# Patient Record
Sex: Male | Born: 1935 | Race: White | Hispanic: No | Marital: Married | Smoking: Former smoker
Health system: Southern US, Community
[De-identification: ages and names within clinical notes are randomized; demographics above are authoritative.]

## PROBLEM LIST (undated history)

## (undated) DIAGNOSIS — J449 Chronic obstructive pulmonary disease, unspecified: Secondary | ICD-10-CM

## (undated) DIAGNOSIS — E119 Type 2 diabetes mellitus without complications: Secondary | ICD-10-CM

## (undated) DIAGNOSIS — I2510 Atherosclerotic heart disease of native coronary artery without angina pectoris: Secondary | ICD-10-CM

## (undated) DIAGNOSIS — I10 Essential (primary) hypertension: Secondary | ICD-10-CM

## (undated) DIAGNOSIS — N189 Chronic kidney disease, unspecified: Secondary | ICD-10-CM

## (undated) DIAGNOSIS — E1140 Type 2 diabetes mellitus with diabetic neuropathy, unspecified: Secondary | ICD-10-CM

## (undated) HISTORY — PX: CHOLECYSTECTOMY: SHX55

## (undated) HISTORY — PX: ABDOMINAL AORTIC ANEURYSM REPAIR: SUR1152

## (undated) HISTORY — PX: CORONARY ARTERY BYPASS GRAFT: SHX141

## (undated) HISTORY — PX: RENAL ARTERY STENT: SHX2321

---

## 2004-09-15 ENCOUNTER — Ambulatory Visit: Payer: Self-pay | Admitting: Internal Medicine

## 2005-01-30 ENCOUNTER — Emergency Department: Payer: Self-pay | Admitting: Emergency Medicine

## 2005-03-20 ENCOUNTER — Ambulatory Visit: Payer: Self-pay | Admitting: Internal Medicine

## 2005-05-25 ENCOUNTER — Ambulatory Visit: Payer: Self-pay | Admitting: Internal Medicine

## 2005-09-22 ENCOUNTER — Ambulatory Visit: Payer: Self-pay | Admitting: Internal Medicine

## 2005-11-02 ENCOUNTER — Ambulatory Visit (HOSPITAL_COMMUNITY): Admission: RE | Admit: 2005-11-02 | Discharge: 2005-11-02 | Payer: Self-pay | Admitting: *Deleted

## 2005-11-11 ENCOUNTER — Ambulatory Visit (HOSPITAL_COMMUNITY): Admission: RE | Admit: 2005-11-11 | Discharge: 2005-11-12 | Payer: Self-pay | Admitting: *Deleted

## 2005-12-10 ENCOUNTER — Ambulatory Visit: Payer: Self-pay | Admitting: *Deleted

## 2006-09-14 ENCOUNTER — Ambulatory Visit: Payer: Self-pay | Admitting: Internal Medicine

## 2007-03-18 ENCOUNTER — Ambulatory Visit: Payer: Self-pay | Admitting: Internal Medicine

## 2007-04-18 ENCOUNTER — Ambulatory Visit: Payer: Self-pay | Admitting: Vascular Surgery

## 2007-12-01 ENCOUNTER — Encounter: Admission: RE | Admit: 2007-12-01 | Discharge: 2007-12-01 | Payer: Self-pay | Admitting: Internal Medicine

## 2008-10-03 ENCOUNTER — Ambulatory Visit: Payer: Self-pay | Admitting: Cardiology

## 2008-10-16 ENCOUNTER — Ambulatory Visit: Payer: Self-pay | Admitting: Thoracic Surgery (Cardiothoracic Vascular Surgery)

## 2008-10-17 ENCOUNTER — Encounter: Payer: Self-pay | Admitting: Thoracic Surgery (Cardiothoracic Vascular Surgery)

## 2008-10-19 ENCOUNTER — Ambulatory Visit: Payer: Self-pay | Admitting: Thoracic Surgery (Cardiothoracic Vascular Surgery)

## 2008-10-19 ENCOUNTER — Inpatient Hospital Stay (HOSPITAL_COMMUNITY)
Admission: RE | Admit: 2008-10-19 | Discharge: 2008-10-29 | Payer: Self-pay | Admitting: Thoracic Surgery (Cardiothoracic Vascular Surgery)

## 2008-11-15 ENCOUNTER — Encounter
Admission: RE | Admit: 2008-11-15 | Discharge: 2008-11-15 | Payer: Self-pay | Admitting: Thoracic Surgery (Cardiothoracic Vascular Surgery)

## 2008-11-15 ENCOUNTER — Ambulatory Visit: Payer: Self-pay | Admitting: Thoracic Surgery (Cardiothoracic Vascular Surgery)

## 2008-12-13 ENCOUNTER — Encounter: Payer: Self-pay | Admitting: Cardiology

## 2008-12-26 ENCOUNTER — Encounter: Payer: Self-pay | Admitting: Cardiology

## 2009-01-25 ENCOUNTER — Encounter: Payer: Self-pay | Admitting: Cardiology

## 2009-06-07 ENCOUNTER — Ambulatory Visit: Payer: Self-pay | Admitting: Internal Medicine

## 2009-06-18 ENCOUNTER — Ambulatory Visit: Payer: Self-pay | Admitting: Vascular Surgery

## 2009-08-05 IMAGING — CR DG CHEST 2V
2 series · 2 of 2 positions shown · non-contrast
Comparison: None

CLINICAL DATA: CAD

CHEST - 2 VIEW

[view not recorded (1 of 2)]
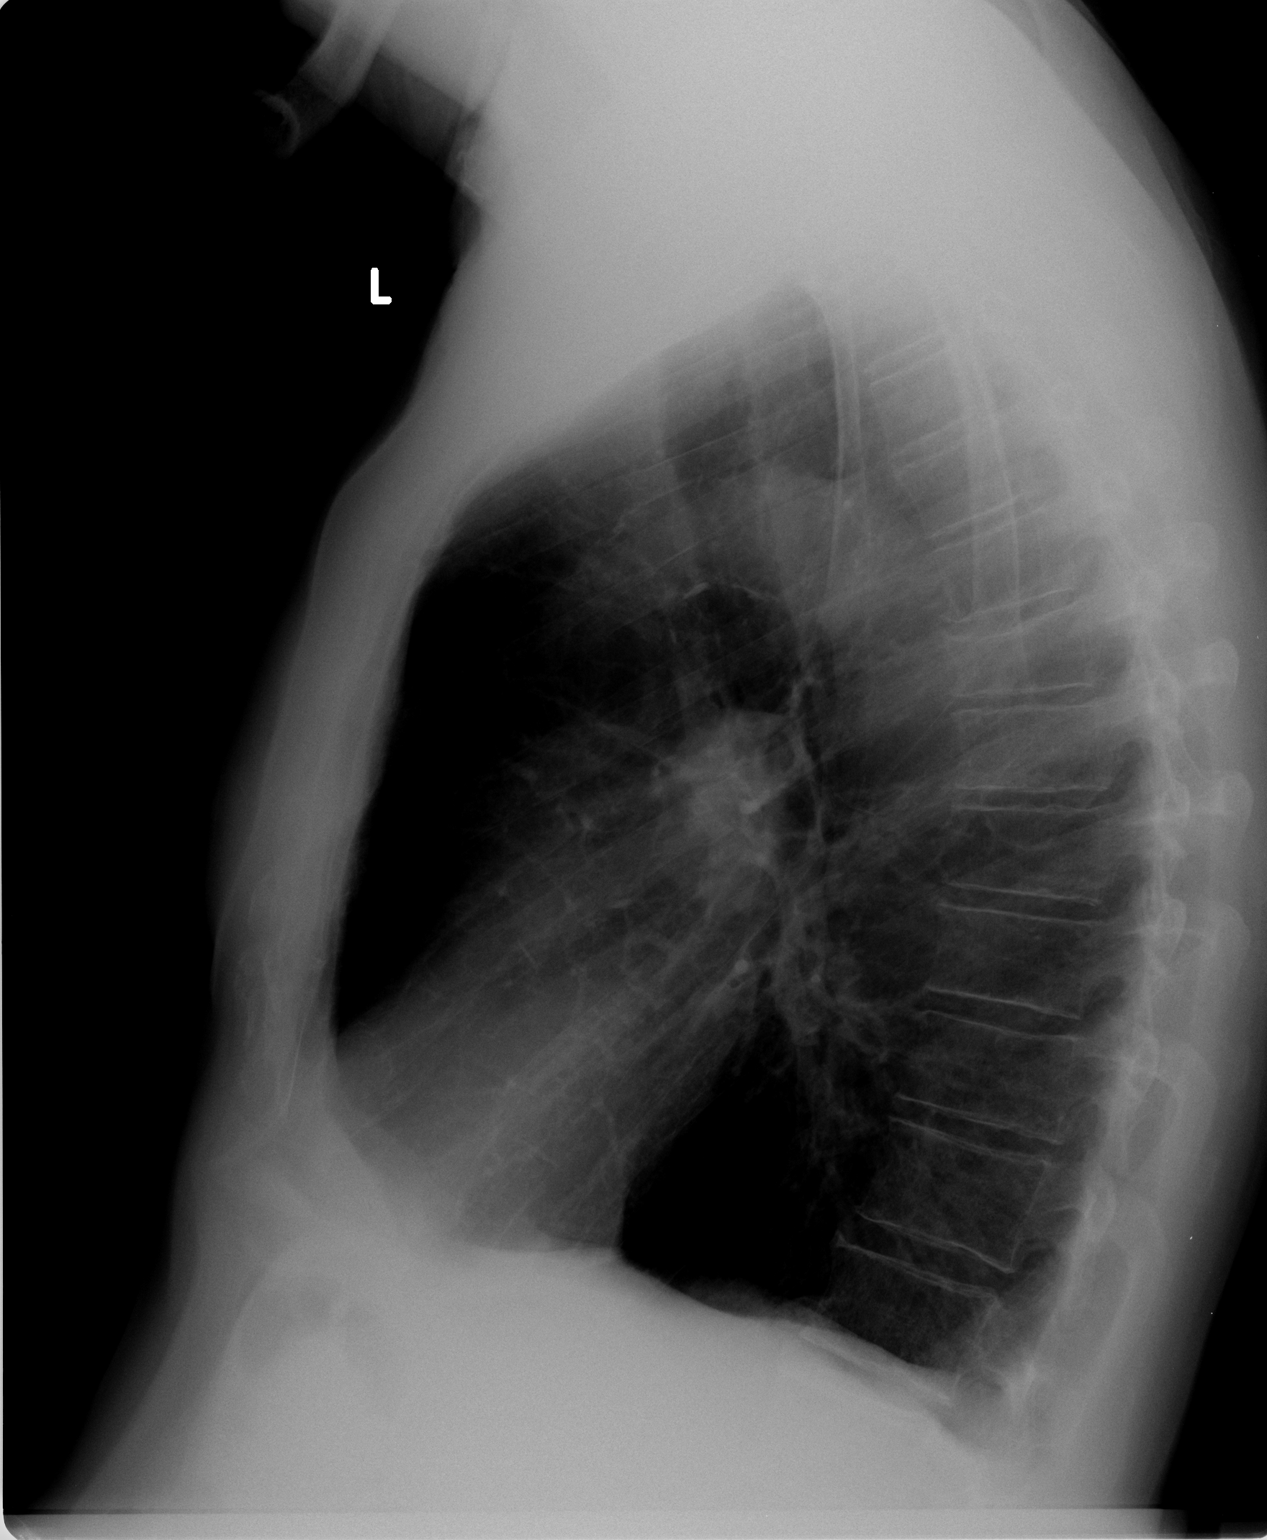

[view not recorded (2 of 2)]
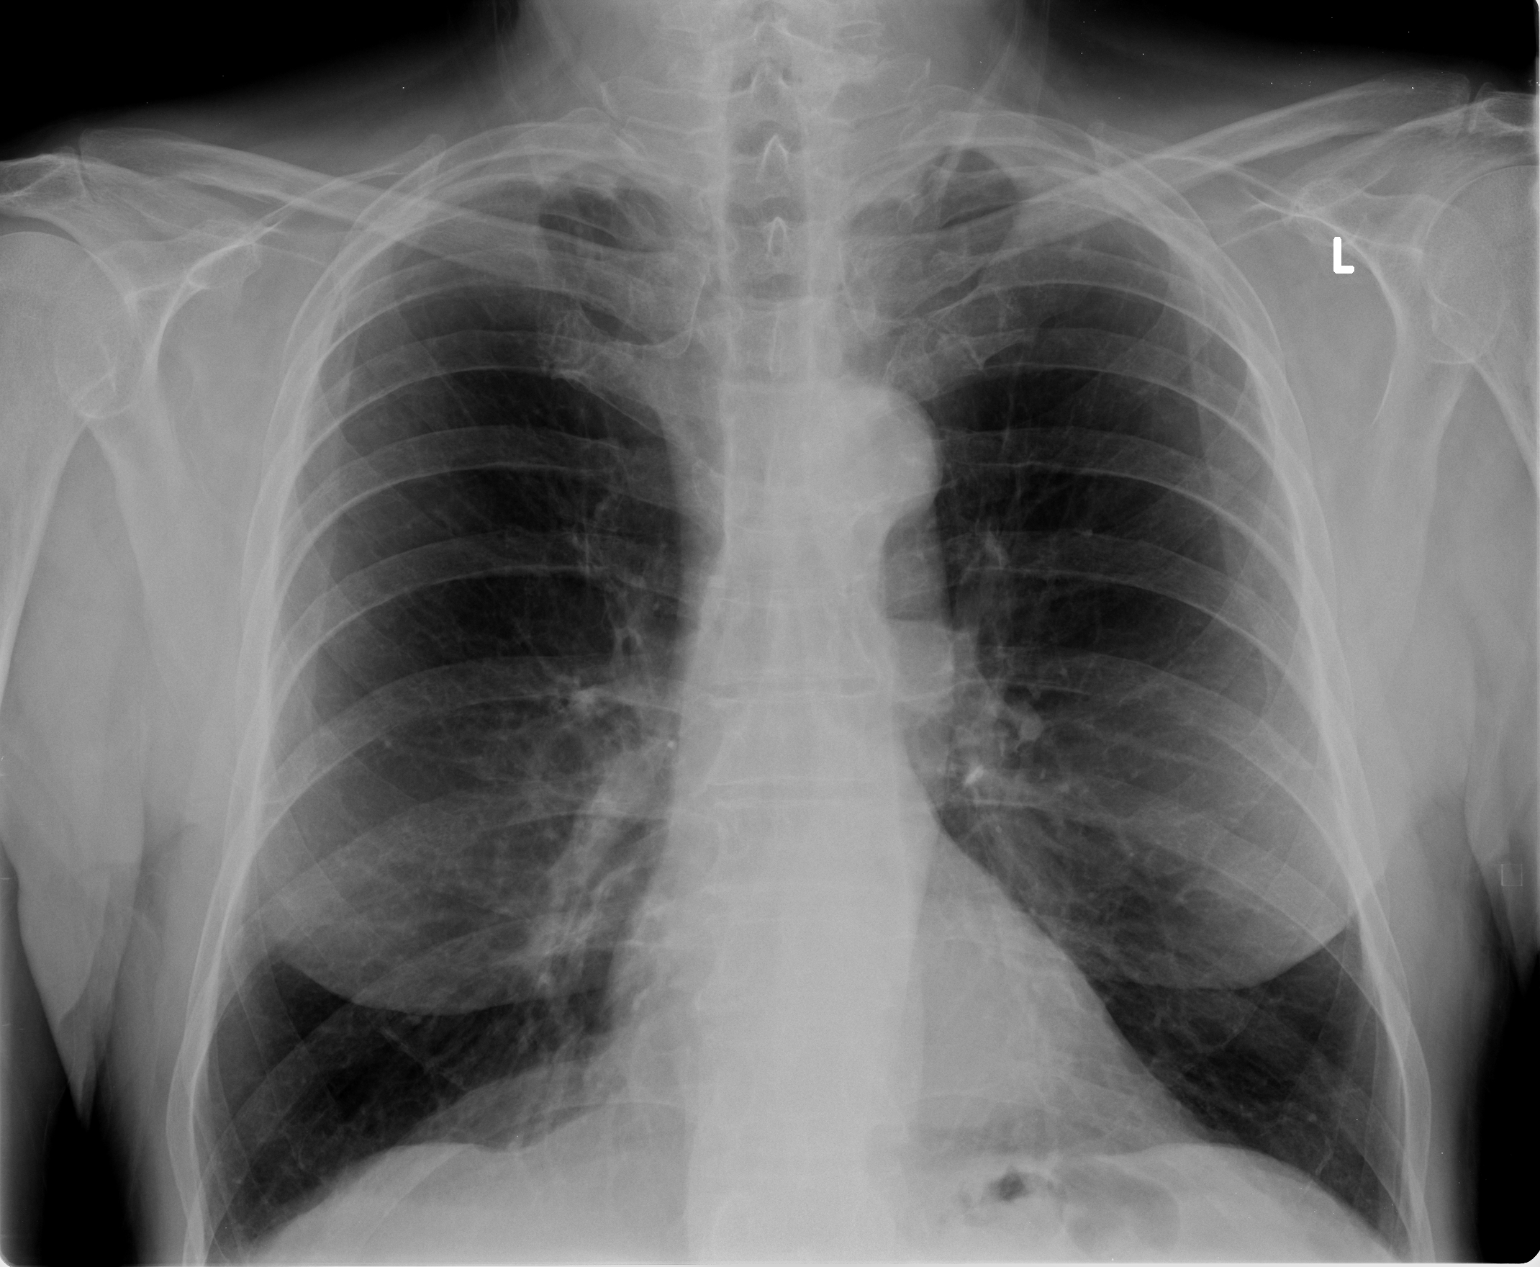

[2 of 2 positions shown; findings below may reference images not displayed]

FINDINGS: Cardiomediastinal silhouette is unremarkable.  No acute
infiltrate or pleural effusion.  No pulmonary edema.  Mild
compression superior endplate of T6 vertebral body of indeterminate
age.
IMPRESSION: No acute infiltrate or edema.  Mild compression of superior
endplate of T6 vertebral body of indeterminate age.  Clinical
correlation is necessary.

## 2009-08-09 IMAGING — CR DG CHEST 1V PORT
1 series · 1 of 1 positions shown · non-contrast
Comparison: 10/20/2008

CLINICAL DATA: Coronary bypass, follow-up

PORTABLE CHEST - 1 VIEW

[AP]
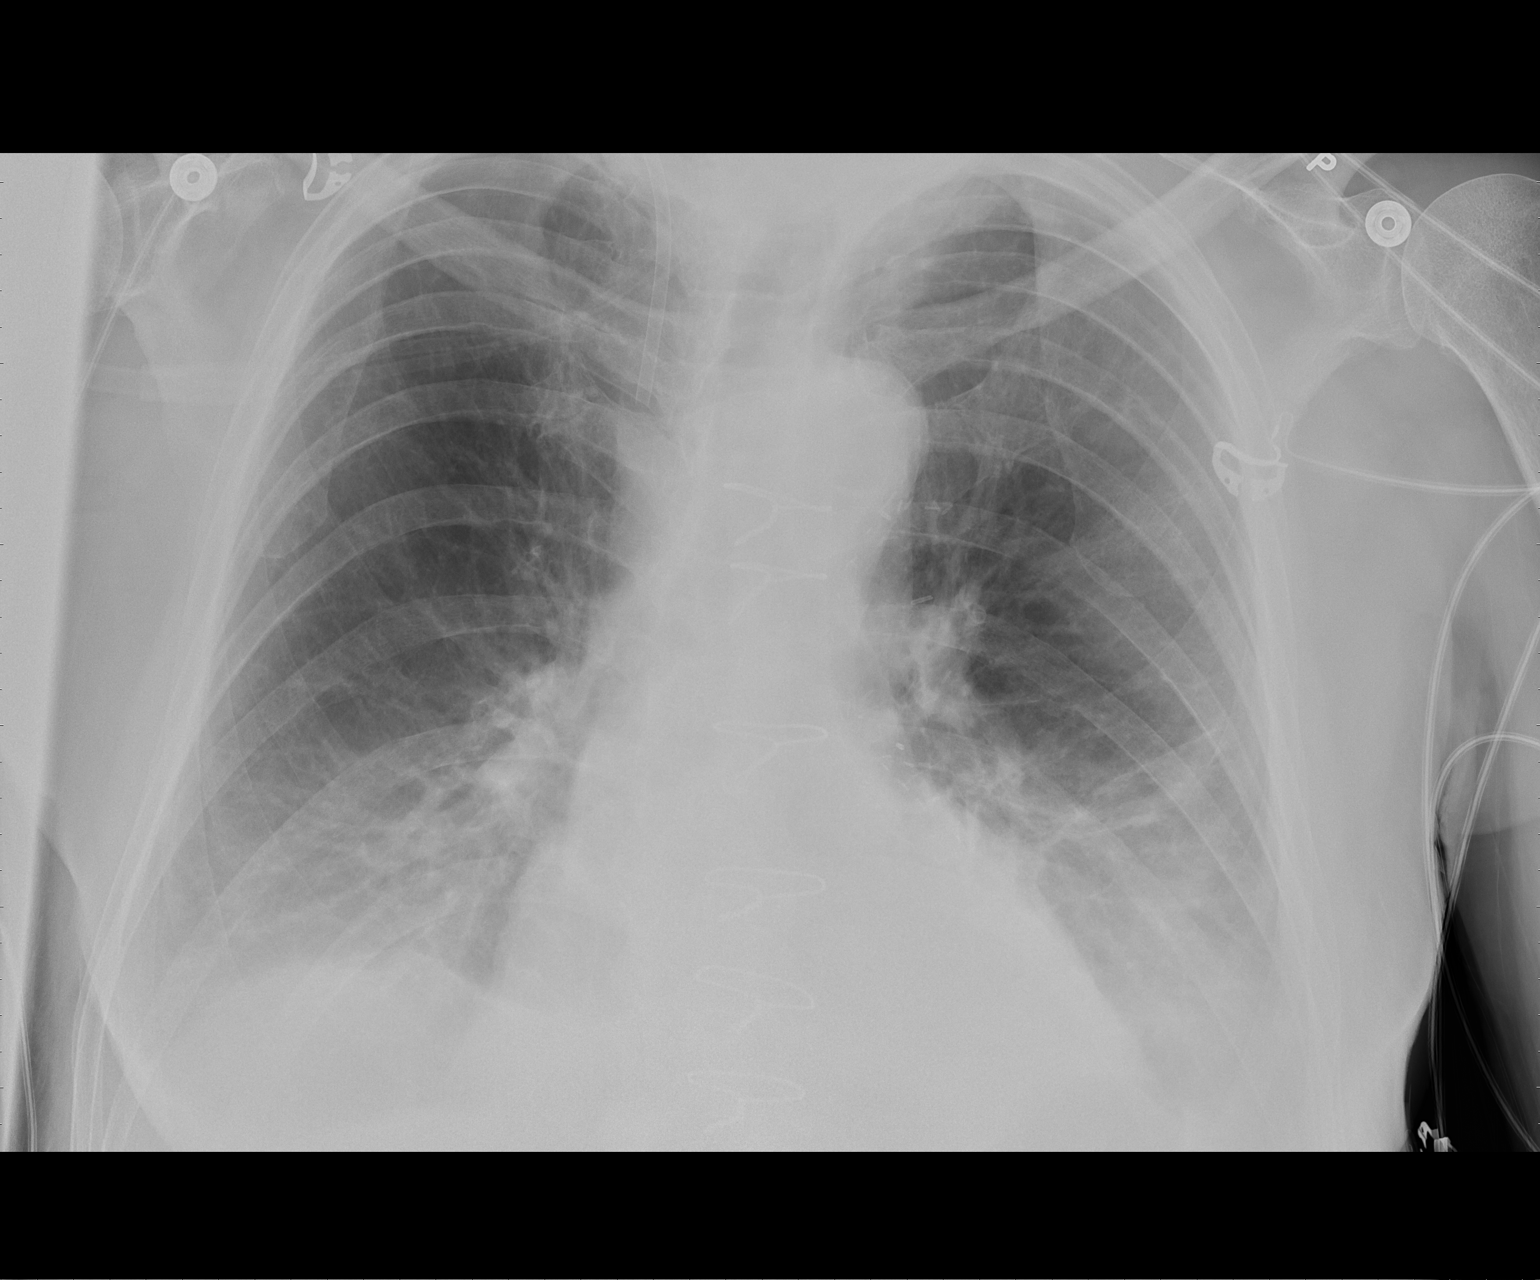

[1 of 1 positions shown; findings below may reference images not displayed]

FINDINGS: Interval removal of the Swan-Ganz catheter, mediastinal
drain and left chest tube.  Increased basilar atelectasis with
small effusions.  Hemidiaphragms are obscured.  Negative for
significant edema.  No pneumothorax.
IMPRESSION: Increased basilar atelectasis and small pleural effusions.

## 2009-08-10 IMAGING — CR DG CHEST 1V PORT
1 series · 1 of 1 positions shown · non-contrast
Comparison: the previous day's study

CLINICAL DATA: Coronary artery disease, postop

PORTABLE CHEST - 1 VIEW

[AP]
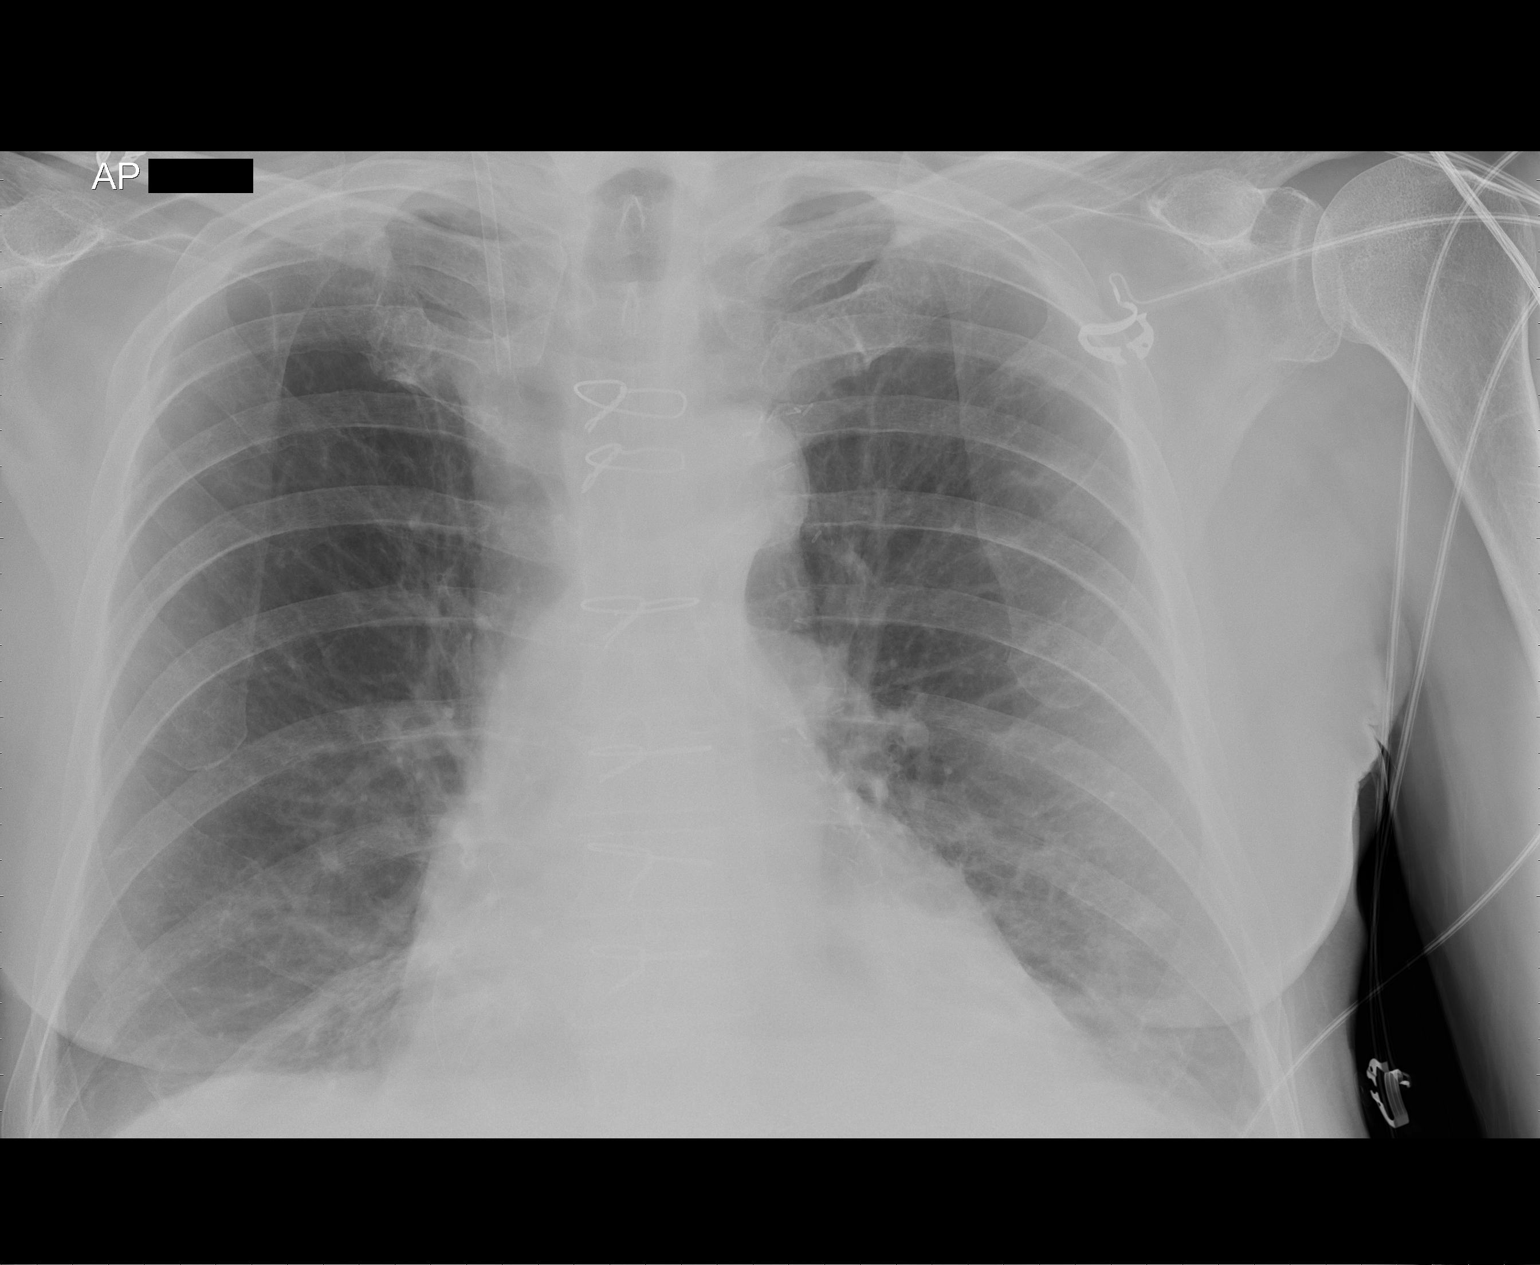

[1 of 1 positions shown; findings below may reference images not displayed]

FINDINGS: Right IJ venous sheath stable.  Previous CABG.  Heart
size upper limits normal.  Improved aeration in the lung bases with
some residual left retrocardiac consolidation / atelectasis.
Question small residual left pleural effusion.
IMPRESSION: 1.  Improved bibasilar aeration with minimal residual left
retrocardiac consolidation / atelectasis.

## 2010-01-25 ENCOUNTER — Ambulatory Visit: Payer: Self-pay | Admitting: Internal Medicine

## 2010-01-28 ENCOUNTER — Ambulatory Visit: Payer: Self-pay | Admitting: Family Medicine

## 2010-01-29 ENCOUNTER — Ambulatory Visit: Payer: Self-pay | Admitting: Urology

## 2010-02-12 ENCOUNTER — Ambulatory Visit: Payer: Self-pay | Admitting: Urology

## 2010-03-12 ENCOUNTER — Emergency Department: Payer: Self-pay | Admitting: Emergency Medicine

## 2010-04-04 ENCOUNTER — Ambulatory Visit: Payer: Self-pay | Admitting: Internal Medicine

## 2010-05-26 ENCOUNTER — Ambulatory Visit: Payer: Self-pay | Admitting: Urology

## 2010-08-03 LAB — BASIC METABOLIC PANEL
BUN: 18 mg/dL (ref 6–23)
CO2: 25 mEq/L (ref 19–32)
CO2: 26 mEq/L (ref 19–32)
CO2: 27 mEq/L (ref 19–32)
CO2: 28 mEq/L (ref 19–32)
CO2: 28 mEq/L (ref 19–32)
Calcium: 8.6 mg/dL (ref 8.4–10.5)
Calcium: 8.8 mg/dL (ref 8.4–10.5)
Chloride: 100 mEq/L (ref 96–112)
Chloride: 98 mEq/L (ref 96–112)
Creatinine, Ser: 1.63 mg/dL — ABNORMAL HIGH (ref 0.4–1.5)
Creatinine, Ser: 1.7 mg/dL — ABNORMAL HIGH (ref 0.4–1.5)
GFR calc Af Amer: 44 mL/min — ABNORMAL LOW (ref >60)
GFR calc Af Amer: 50 mL/min — ABNORMAL LOW (ref >60)
GFR calc Af Amer: 50 mL/min — ABNORMAL LOW (ref >60)
GFR calc non Af Amer: 41 mL/min — ABNORMAL LOW (ref >60)
GFR calc non Af Amer: 41 mL/min — ABNORMAL LOW (ref >60)
Glucose, Bld: 63 mg/dL — ABNORMAL LOW (ref 70–99)
Glucose, Bld: 82 mg/dL (ref 70–99)
Potassium: 3.8 mEq/L (ref 3.5–5.1)
Potassium: 4 mEq/L (ref 3.5–5.1)
Potassium: 4 mEq/L (ref 3.5–5.1)
Sodium: 131 mEq/L — ABNORMAL LOW (ref 135–145)
Sodium: 134 mEq/L — ABNORMAL LOW (ref 135–145)
Sodium: 135 mEq/L (ref 135–145)

## 2010-08-03 LAB — GLUCOSE, CAPILLARY
Glucose-Capillary: 104 mg/dL — ABNORMAL HIGH (ref 70–99)
Glucose-Capillary: 112 mg/dL — ABNORMAL HIGH (ref 70–99)
Glucose-Capillary: 124 mg/dL — ABNORMAL HIGH (ref 70–99)
Glucose-Capillary: 162 mg/dL — ABNORMAL HIGH (ref 70–99)
Glucose-Capillary: 183 mg/dL — ABNORMAL HIGH (ref 70–99)
Glucose-Capillary: 195 mg/dL — ABNORMAL HIGH (ref 70–99)
Glucose-Capillary: 64 mg/dL — ABNORMAL LOW (ref 70–99)
Glucose-Capillary: 86 mg/dL (ref 70–99)

## 2010-08-03 LAB — CBC
HCT: 25.8 % — ABNORMAL LOW (ref 39.0–52.0)
Hemoglobin: 9.1 g/dL — ABNORMAL LOW (ref 13.0–17.0)
MCV: 91.2 fL (ref 78.0–100.0)
RBC: 2.82 MIL/uL — ABNORMAL LOW (ref 4.22–5.81)
WBC: 7.2 10*3/uL (ref 4.0–10.5)

## 2010-08-04 LAB — POCT I-STAT 4, (NA,K, GLUC, HGB,HCT)
Glucose, Bld: 134 mg/dL — ABNORMAL HIGH (ref 70–99)
Glucose, Bld: 142 mg/dL — ABNORMAL HIGH (ref 70–99)
Glucose, Bld: 148 mg/dL — ABNORMAL HIGH (ref 70–99)
Glucose, Bld: 164 mg/dL — ABNORMAL HIGH (ref 70–99)
Glucose, Bld: 215 mg/dL — ABNORMAL HIGH (ref 70–99)
HCT: 21 % — ABNORMAL LOW (ref 39.0–52.0)
HCT: 21 % — ABNORMAL LOW (ref 39.0–52.0)
HCT: 29 % — ABNORMAL LOW (ref 39.0–52.0)
HCT: 34 % — ABNORMAL LOW (ref 39.0–52.0)
Hemoglobin: 7.1 g/dL — CL (ref 13.0–17.0)
Hemoglobin: 7.1 g/dL — CL (ref 13.0–17.0)
Hemoglobin: 7.8 g/dL — CL (ref 13.0–17.0)
Hemoglobin: 7.8 g/dL — CL (ref 13.0–17.0)
Hemoglobin: 9.9 g/dL — ABNORMAL LOW (ref 13.0–17.0)
Potassium: 4 mEq/L (ref 3.5–5.1)
Potassium: 4 mEq/L (ref 3.5–5.1)
Potassium: 4.1 mEq/L (ref 3.5–5.1)
Potassium: 4.2 mEq/L (ref 3.5–5.1)
Potassium: 4.7 mEq/L (ref 3.5–5.1)
Potassium: 5.2 mEq/L — ABNORMAL HIGH (ref 3.5–5.1)
Sodium: 133 mEq/L — ABNORMAL LOW (ref 135–145)
Sodium: 135 mEq/L (ref 135–145)
Sodium: 136 mEq/L (ref 135–145)
Sodium: 138 mEq/L (ref 135–145)
Sodium: 142 mEq/L (ref 135–145)

## 2010-08-04 LAB — COMPREHENSIVE METABOLIC PANEL
ALT: 15 U/L (ref 0–53)
AST: 16 U/L (ref 0–37)
AST: 27 U/L (ref 0–37)
Albumin: 3.5 g/dL (ref 3.5–5.2)
Albumin: 3.7 g/dL (ref 3.5–5.2)
Alkaline Phosphatase: 78 U/L (ref 39–117)
Calcium: 9 mg/dL (ref 8.4–10.5)
Chloride: 99 mEq/L (ref 96–112)
Creatinine, Ser: 1.66 mg/dL — ABNORMAL HIGH (ref 0.4–1.5)
GFR calc Af Amer: 49 mL/min — ABNORMAL LOW (ref >60)
GFR calc Af Amer: 55 mL/min — ABNORMAL LOW (ref >60)
Potassium: 4.6 mEq/L (ref 3.5–5.1)
Sodium: 138 mEq/L (ref 135–145)
Total Bilirubin: 0.9 mg/dL (ref 0.3–1.2)
Total Protein: 6.7 g/dL (ref 6.0–8.3)

## 2010-08-04 LAB — CBC
HCT: 26.3 % — ABNORMAL LOW (ref 39.0–52.0)
HCT: 28 % — ABNORMAL LOW (ref 39.0–52.0)
HCT: 29.9 % — ABNORMAL LOW (ref 39.0–52.0)
HCT: 32.9 % — ABNORMAL LOW (ref 39.0–52.0)
HCT: 33.2 % — ABNORMAL LOW (ref 39.0–52.0)
Hemoglobin: 11.5 g/dL — ABNORMAL LOW (ref 13.0–17.0)
Hemoglobin: 9.1 g/dL — ABNORMAL LOW (ref 13.0–17.0)
Hemoglobin: 9.1 g/dL — ABNORMAL LOW (ref 13.0–17.0)
MCHC: 33.5 g/dL (ref 30.0–36.0)
MCHC: 34.5 g/dL (ref 30.0–36.0)
MCHC: 35 g/dL (ref 30.0–36.0)
MCHC: 35.2 g/dL (ref 30.0–36.0)
MCV: 90.1 fL (ref 78.0–100.0)
MCV: 91.1 fL (ref 78.0–100.0)
MCV: 91.6 fL (ref 78.0–100.0)
MCV: 92.3 fL (ref 78.0–100.0)
Platelets: 100 10*3/uL — ABNORMAL LOW (ref 150–400)
Platelets: 166 10*3/uL (ref 150–400)
Platelets: 80 10*3/uL — ABNORMAL LOW (ref 150–400)
Platelets: 89 10*3/uL — ABNORMAL LOW (ref 150–400)
Platelets: 92 10*3/uL — ABNORMAL LOW (ref 150–400)
RBC: 2.87 MIL/uL — ABNORMAL LOW (ref 4.22–5.81)
RBC: 3.23 MIL/uL — ABNORMAL LOW (ref 4.22–5.81)
RBC: 3.65 MIL/uL — ABNORMAL LOW (ref 4.22–5.81)
RDW: 12.9 % (ref 11.5–15.5)
RDW: 13.1 % (ref 11.5–15.5)
RDW: 13.2 % (ref 11.5–15.5)
RDW: 13.7 % (ref 11.5–15.5)
RDW: 14 % (ref 11.5–15.5)
RDW: 14.2 % (ref 11.5–15.5)
WBC: 11.7 10*3/uL — ABNORMAL HIGH (ref 4.0–10.5)
WBC: 13.8 10*3/uL — ABNORMAL HIGH (ref 4.0–10.5)
WBC: 6.4 10*3/uL (ref 4.0–10.5)
WBC: 7 10*3/uL (ref 4.0–10.5)

## 2010-08-04 LAB — POCT I-STAT 3, ART BLOOD GAS (G3+)
Acid-Base Excess: 1 mmol/L (ref 0.0–2.0)
Acid-base deficit: 2 mmol/L (ref 0.0–2.0)
Acid-base deficit: 2 mmol/L (ref 0.0–2.0)
Bicarbonate: 23.2 mEq/L (ref 20.0–24.0)
Bicarbonate: 24 mEq/L (ref 20.0–24.0)
Bicarbonate: 25.5 mEq/L — ABNORMAL HIGH (ref 20.0–24.0)
Bicarbonate: 26.3 mEq/L — ABNORMAL HIGH (ref 20.0–24.0)
O2 Saturation: 100 %
O2 Saturation: 100 %
O2 Saturation: 100 %
O2 Saturation: 93 %
O2 Saturation: 94 %
Patient temperature: 33.8
TCO2: 24 mmol/L (ref 0–100)
TCO2: 25 mmol/L (ref 0–100)
TCO2: 27 mmol/L (ref 0–100)
TCO2: 28 mmol/L (ref 0–100)
TCO2: 30 mmol/L (ref 0–100)
pCO2 arterial: 47.5 mmHg — ABNORMAL HIGH (ref 35.0–45.0)
pCO2 arterial: 50.3 mmHg — ABNORMAL HIGH (ref 35.0–45.0)
pCO2 arterial: 50.4 mmHg — ABNORMAL HIGH (ref 35.0–45.0)
pH, Arterial: 7.313 — ABNORMAL LOW (ref 7.350–7.450)
pH, Arterial: 7.326 — ABNORMAL LOW (ref 7.350–7.450)
pH, Arterial: 7.392 (ref 7.350–7.450)
pH, Arterial: 7.394 (ref 7.350–7.450)
pO2, Arterial: 296 mmHg — ABNORMAL HIGH (ref 80.0–100.0)
pO2, Arterial: 303 mmHg — ABNORMAL HIGH (ref 80.0–100.0)
pO2, Arterial: 398 mmHg — ABNORMAL HIGH (ref 80.0–100.0)
pO2, Arterial: 72 mmHg — ABNORMAL LOW (ref 80.0–100.0)

## 2010-08-04 LAB — POCT I-STAT 3, VENOUS BLOOD GAS (G3P V)
Acid-base deficit: 3 mmol/L — ABNORMAL HIGH (ref 0.0–2.0)
Bicarbonate: 24.2 mEq/L — ABNORMAL HIGH (ref 20.0–24.0)
O2 Saturation: 81 %
TCO2: 26 mmol/L (ref 0–100)
pO2, Ven: 51 mmHg — ABNORMAL HIGH (ref 30.0–45.0)

## 2010-08-04 LAB — MAGNESIUM
Magnesium: 2.4 mg/dL (ref 1.5–2.5)
Magnesium: 2.5 mg/dL (ref 1.5–2.5)

## 2010-08-04 LAB — WOUND CULTURE

## 2010-08-04 LAB — GLUCOSE, CAPILLARY
Glucose-Capillary: 105 mg/dL — ABNORMAL HIGH (ref 70–99)
Glucose-Capillary: 114 mg/dL — ABNORMAL HIGH (ref 70–99)
Glucose-Capillary: 131 mg/dL — ABNORMAL HIGH (ref 70–99)
Glucose-Capillary: 142 mg/dL — ABNORMAL HIGH (ref 70–99)
Glucose-Capillary: 145 mg/dL — ABNORMAL HIGH (ref 70–99)
Glucose-Capillary: 149 mg/dL — ABNORMAL HIGH (ref 70–99)
Glucose-Capillary: 162 mg/dL — ABNORMAL HIGH (ref 70–99)
Glucose-Capillary: 180 mg/dL — ABNORMAL HIGH (ref 70–99)
Glucose-Capillary: 184 mg/dL — ABNORMAL HIGH (ref 70–99)
Glucose-Capillary: 211 mg/dL — ABNORMAL HIGH (ref 70–99)
Glucose-Capillary: 219 mg/dL — ABNORMAL HIGH (ref 70–99)

## 2010-08-04 LAB — BLOOD GAS, ARTERIAL
FIO2: 0.21 %
Patient temperature: 98.6
pCO2 arterial: 38.3 mmHg (ref 35.0–45.0)
pH, Arterial: 7.422 (ref 7.350–7.450)

## 2010-08-04 LAB — TYPE AND SCREEN: Antibody Screen: NEGATIVE

## 2010-08-04 LAB — URINALYSIS, ROUTINE W REFLEX MICROSCOPIC
Ketones, ur: NEGATIVE mg/dL
Leukocytes, UA: NEGATIVE
Nitrite: NEGATIVE
Specific Gravity, Urine: 1.028 (ref 1.005–1.030)
pH: 5.5 (ref 5.0–8.0)

## 2010-08-04 LAB — BASIC METABOLIC PANEL
BUN: 21 mg/dL (ref 6–23)
CO2: 24 mEq/L (ref 19–32)
CO2: 25 mEq/L (ref 19–32)
CO2: 28 mEq/L (ref 19–32)
Calcium: 8.5 mg/dL (ref 8.4–10.5)
Calcium: 9 mg/dL (ref 8.4–10.5)
Calcium: 9.1 mg/dL (ref 8.4–10.5)
Chloride: 102 mEq/L (ref 96–112)
Chloride: 102 mEq/L (ref 96–112)
Chloride: 109 mEq/L (ref 96–112)
Creatinine, Ser: 1.64 mg/dL — ABNORMAL HIGH (ref 0.4–1.5)
Creatinine, Ser: 1.73 mg/dL — ABNORMAL HIGH (ref 0.4–1.5)
GFR calc Af Amer: 51 mL/min — ABNORMAL LOW (ref >60)
GFR calc Af Amer: 53 mL/min — ABNORMAL LOW (ref >60)
GFR calc Af Amer: >60 mL/min (ref >60)
GFR calc non Af Amer: 43 mL/min — ABNORMAL LOW (ref >60)
Glucose, Bld: 111 mg/dL — ABNORMAL HIGH (ref 70–99)
Glucose, Bld: 151 mg/dL — ABNORMAL HIGH (ref 70–99)
Potassium: 3.8 mEq/L (ref 3.5–5.1)
Potassium: 4 mEq/L (ref 3.5–5.1)
Potassium: 4.3 mEq/L (ref 3.5–5.1)
Sodium: 131 mEq/L — ABNORMAL LOW (ref 135–145)
Sodium: 132 mEq/L — ABNORMAL LOW (ref 135–145)
Sodium: 136 mEq/L (ref 135–145)

## 2010-08-04 LAB — POCT I-STAT GLUCOSE: Operator id: 230421

## 2010-08-04 LAB — POCT I-STAT, CHEM 8
Calcium, Ion: 1.27 mmol/L (ref 1.12–1.32)
Chloride: 106 mEq/L (ref 96–112)
Creatinine, Ser: 1.3 mg/dL (ref 0.4–1.5)
Glucose, Bld: 181 mg/dL — ABNORMAL HIGH (ref 70–99)
Glucose, Bld: 186 mg/dL — ABNORMAL HIGH (ref 70–99)
HCT: 31 % — ABNORMAL LOW (ref 39.0–52.0)
Hemoglobin: 10.5 g/dL — ABNORMAL LOW (ref 13.0–17.0)
Potassium: 4.4 mEq/L (ref 3.5–5.1)
Potassium: 4.6 mEq/L (ref 3.5–5.1)

## 2010-08-04 LAB — HEMOGLOBIN A1C: Mean Plasma Glucose: 177 mg/dL

## 2010-08-04 LAB — CREATININE, SERUM
Creatinine, Ser: 1.26 mg/dL (ref 0.4–1.5)
Creatinine, Ser: 1.71 mg/dL — ABNORMAL HIGH (ref 0.4–1.5)
GFR calc non Af Amer: 39 mL/min — ABNORMAL LOW (ref >60)

## 2010-08-04 LAB — PLATELET COUNT: Platelets: 115 10*3/uL — ABNORMAL LOW (ref 150–400)

## 2010-08-04 LAB — APTT: aPTT: 29 seconds (ref 24–37)

## 2010-09-09 NOTE — Discharge Summary (Signed)
NAME:  ARAD, BURSTON NO.:  [id number]   MEDICAL RECORD NO.:  [id number]          PATIENT TYPE:  INP   LOCATION:  2041                         FACILITY:  [facility]   PHYSICIAN:  Salvatore Decent. Dorris Fetch, M.D.DATE OF BIRTH:  May 25, 1929   DATE OF ADMISSION:  10/19/2008  DATE OF DISCHARGE:                               DISCHARGE SUMMARY   FINAL DIAGNOSES:  1. Left main coronary artery disease, mild mitral regurgitation.  2. 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  3. Moderate tricuspid regurgitation.   IN-HOSPITAL DIAGNOSES:  1. Postoperative atrial fibrillation.  2. Acute renal insufficiency postoperatively.  3. Volume overload postoperatively.  4. Acute blood loss anemia postoperatively.   SECONDARY DIAGNOSES:  1. History of abdominal aortic aneurysm.  2. Diabetes mellitus.  3. Stage I chronic kidney disease.  4. Diabetic neuropathy.  5. History of shingles with postoperative traumatic neuralgia.  6. Chronic obstructive pulmonary disease.  7. Peripheral vascular disease with stents in both legs.  8. Proteinuria.  9. Hyperlipidemia.   IN-HOSPITAL OPERATIONS AND PROCEDURES:  1. Coronary artery bypass grafting x3 using left internal mammary      artery to left anterior descending, saphenous vein graft      sequentially to obtuse marginal and left posterior descending.      Endoscopic vein harvest from right thigh done.  2. Repair of perforation of atrial leaflet of mitral valve with      pericardial patch.  3. Intraoperative transesophageal echocardiogram.   HISTORY AND PHYSICAL AND HOSPITAL COURSE:  Mr. Klemann is a 77 year old  gentleman who recently was found on cardiac catheterization to have 60%  left main stenosis and echocardiogram demonstrating moderate mitral  regurgitation as well as moderate tricuspid regurgitation.  The patient  was referred for coronary  artery bypass grafting with possible mitral  valve repair or replacement.  The patient was seen and evaluated by Dr.  Dorris Fetch.  Dr. Dorris Fetch discussed risks and benefits with the  patient.  The patient acknowledged understanding and agreed to proceed.  Surgery was scheduled for October 19, 2008.  For details of the patient's  past medical history and physical exam, please see dictated H&P.   The patient was taken to the operating room on October 19, 2008, where he  underwent coronary artery bypass grafting x3 using a left internal  mammary artery to left anterior descending, saphenous vein graft  sequentially to obtuse marginal 1 and left posterior descending.  Endoscopic vein harvesting from the right thigh was done.  The patient  also underwent repair of perforation of anterior leaflet of mitral valve  with pericardial patch.  The patient tolerated this procedure and was  transferred to the Intensive Care Unit in stable condition.  Postoperatively, the patient was noted to be hemodynamically stable.  He  was noted to develop a truncal rash on the evening of surgery and  received Solu-Medrol and Benadryl for it.  He was able to be extubated  in the evening of surgery.  Post-extubation, the patient was noted to  be  alert and oriented x4, neuro intact.  Postoperatively, the patient was  noted to be in normal sinus rhythm.  His blood pressure was stable.  The  patient was on amiodarone and dopamine, but were able to be weaned and  discontinued.  On postop day #2 evening, the patient went into rapid  atrial fibrillation.  He was started on IV amiodarone.  After starting  IV amiodarone, the patient was able to convert to normal sinus rhythm.  IV amiodarone was switched to p.o.  The patient has remained in normal  sinus rhythm.  He was started on a low-dose beta-blocker.  The patient's  blood pressure and heart rate were followed and remained stable.  Postoperatively, a chest x-ray was  obtained on postop day #1.  This was  stable with some mild atelectasis.  The patient had minimal drainage  from chest tubes and chest tubes were discontinued in normal fashion.  The patient was encouraged to use his incentive spirometer.  He was  eventually able to be weaned off oxygen saturating greater than 90% on  room air.  Postoperatively, the patient did develop renal insufficiency.  He does have history of stage I chronic kidney disease.  His creatinine  was followed closely.  It was initially improving, but by postop day #5  started to go back up.  On postop day #6, it increased to 1.81.  The  patient was not on any medications that would cause increase in  creatinine.  He was also orthostatic at that time.  Question of the  patient was dry.  He received IV fluids.  Followup creatinine started to  trend down and currently is 1.63.  We will continue to monitor.  As  stated above, the patient did have some orthostatic changes.  This was  followed and stabilized.  The patient was up ambulating with cardiac  rehab improving.  Postoperatively, the patient did develop acute blood  loss anemia.  His hemoglobin and hematocrit were monitored closely.  He  did not require any transfusions.  Hemoglobin/hematocrit was followed  and remained stable.  The patient did have some volume overload  postoperatively.  Secondary to his renal insufficiency, Lasix was not  started.  He did have daily weight checks.  He still remained volume  overload, but waiting on improvement of creatinine prior to starting  diuretics.  The patient was also not started back on ACE inhibitor  secondary to his renal insufficiency.  Blood pressure noted to be stable  on beta-blocker and will be followed up as outpatient.   The patient was transferred out to [facility] on postop day #4.  While in  telemetry floor, the patient's vital signs were continued to followed  closely.  He remained afebrile.  Again, he remained in normal  sinus  rhythm and blood pressure stable.  The patient was up ambulating with  cardiac rehab.  He was tolerating diet well.  All incisions were clean,  dry, and intact and healing well.  The patient was noted to be diabetic.  He was restarted on Amaryl p.o. postoperatively.  The patient's blood  sugars continued to remain elevated.  He was started on Lantus insulin.  Blood sugars were followed and did stabilize.  We will plan to continue  the patient on Lantus insulin and Amaryl at the time of discharge.  He  was not started on a second diabetic agent secondary to his renal  insufficiency.   The patient's most recent lab work  shows sodium of 137, potassium 3.7,  chloride of 102, bicarbonate 25, BUN of 25, creatinine 1.63, and glucose  of 63.  White blood cell count 7.2, hemoglobin of 9.1, hematocrit 25.8,  and platelet count of 144.  The patient is tentatively ready for  discharge home in the next 24 for 8 hours pending he remained stable.   FOLLOWUP APPOINTMENTS:  A followup appointment has been arranged with  Dr. Dorris Fetch for November 15, 2008, at 12 p.m.  The patient will need to  obtain PA and lateral chest x-ray 30 minutes prior to this appointment.  The patient will need to follow up with [name] in 2 weeks.  He  will need to contact his office to schedule this appointment.  The  patient will also need to follow up with his primary care physician in 1-  2 weeks for diabetes management.  He will also need to contact his  doctor's office to schedule this appointment.   ACTIVITY:  The patient is instructed no driving until released to do so,  no heavy lifting over 10 pounds.  He is told to ambulate 3-4 times per  day, progress as tolerated and continue his breathing exercises.   INCISIONAL CARE:  The patient is told to shower washing his incisions  using soap and water.  He is contact the office if he develops any  drainage or opening from any of his incision sites.   DIET:   He is educated on diet to be low fat, low salt.   DISCHARGE MEDICATIONS:  1. Combivent inhaler at night.  2. Folic acid daily.  3. Glimepiride 8 mg daily.  4. Vitamin D3 1000 units daily.  5. PreserVision t.i.d.  6. Aspirin 325 mg daily.  7. Toprol-XL 25 mg daily.  8. Amiodarone 400 mg t.i.d. x2 days and 200 mg b.i.d.  9. Lantus insulin 20 units at night.  10.Oxycodone 5 mg 1-2 tabs q.4-6 hours p.r.n. pain.      Sol Blazing, PA      Salvatore Decent. Dorris Fetch, M.D.  Electronically Signed    KMD/MEDQ  D:  10/26/2008  T:  10/27/2008  Job:  [id number]   cc:   Marcina Millard, MD

## 2010-09-09 NOTE — H&P (Signed)
NAME:  Kenneth Massey, Kenneth Massey NO.:  [id number]   MEDICAL RECORD NO.:  [id number]          PATIENT TYPE:  INP   LOCATION:                               FACILITY:  [facility]   PHYSICIAN:  Kenneth Decent. Dorris Massey, M.D.DATE OF BIRTH:  1930-03-18   DATE OF ADMISSION:  10/19/2008  DATE OF DISCHARGE:                              HISTORY & PHYSICAL   REASON FOR CONSULTATION:  Left main coronary disease and mitral  regurgitation.   HISTORY OF PRESENT ILLNESS:  Kenneth Massey is a 77 year old gentleman sent  for consultation by Dr. Marcina Massey regarding a 60% ostial left  main lesion and moderate mitral regurgitation.  Kenneth Massey is somewhat  of a poor historian.  He states that he saw his doctor, Dr. Clearance Massey,  recently who did some tests and then referred him to Dr. Darrold Massey for  further evaluation.  He says that he was not particularly having  symptoms and he specifically denied chest pain but has been feeling  fatigued recently.  He had an echocardiogram done on Sep 20, 2008, which  showed diffuse decreased wall motion.  His ejection fraction was  estimated 20% to 25%.  He had mitral regurgitation and aortic sclerosis  without stenosis, but did have moderate mitral and tricuspid  regurgitation.  There was not significant mitral stenosis, but there was  possible mitral valve vegetation or sclerosis of the leaflets.  The  mitral valve was described as calcified and sclerotic.  Unfortunately, I  do not have a copy of the films to review.  Of note, he had a previous  functional study in December 2008 which showed a normal left ventricular  ejection fraction at that time.  After the findings of the  echocardiogram were known, Dr. Darrold Massey recommended right and left  heart catheterization, and that was done on June 9th.  That showed total  occlusion of his right coronary artery and a 60% stenosis in his left  main which was a dominant vessel.  There was also a 50% mid-LAD lesion  and his EF was estimated at 46%.  He also was seen to have an abdominal  aortic aneurysm which has been measured at 4 cm by echocardiography.  The right heart catheterization showed right atrial pressure of 12,  right ventricular pressure of 32/10.  PA was 32/14 with a mean of 22.  His wedge was 10.  His cardiac index was 2.17.   PAST MEDICAL HISTORY:  Significant for abdominal aortic aneurysm,  diabetes, stage 1 chronic kidney disease, diabetic neuropathy, shingles  with post-traumatic neuralgia, COPD, abdominal aortic aneurysm,  peripheral vascular disease with stents in both legs, proteinuria,  hyperlipidemia.   CURRENT MEDICATIONS:  1. Combivent MDI 2 puffs q.h.s.  2. Neurontin 100 mg b.i.d. which he seldom takes.  3. Amaryl 4 mg b.i.d.  4. Centrum Silver 1 tablet daily.  5. Enalapril 5 mg daily.  6. Folic acid 0.8 mg daily.  7. Gentamicin ophthalmic solution 0.3% one drop t.i.d.  8. PreserVision b.i.d.  9. Vitamin D3 1000 International Units daily.   He has NO KNOWN DRUG ALLERGIES.  FAMILY HISTORY:  Father died of an MI at age 43.   SOCIAL HISTORY:  He is married.  He is retired.  He has 1 adult child.  He has had a 30 pack-year history of smoking.  He quit 25 years ago.  He  does not drink alcohol.  The patient says he remains relatively active.   REVIEW OF SYSTEMS:  He denies a history of rheumatic fever.  No  significant weight change recently.  He denies any recent episodes  fevers, chills, or night sweats.  All other systems are negative with  the exception of chronic upper left-sided back and chest pain from  shingles.   PHYSICAL EXAMINATION:  Kenneth Massey is a 77 year old male in no acute  distress.  His blood pressure is 131/82, pulse 83, respirations 18.  His oxygen  saturation is 98% on room air.  NEUROLOGICAL:  He is alert and oriented x3 with no focal deficits.  HEENT EXAM:  Unremarkable.  He is a edentulous and wearing dentures.  NECK:  Supple without  thyromegaly, adenopathy, or bruits.  CARDIAC EXAM:  Regular rate and rhythm.  Normal S1 and S2.  There is a  2/6 systolic murmur.  LUNGS:  Clear with equal breath sounds bilaterally.  There are no rales  or wheezing.  ABDOMEN:  Protuberant, soft, and nontender.  EXTREMITIES:  Without clubbing, cyanosis, or edema.  Unable to palpate  distal pulses on the legs.  He has 2+ radial pulses bilaterally.  SKIN:  Warm and dry without rash.   LABORATORY DATA:  An EKG shows first-degree AV block with an intra-  atrial conduction delay.  His white count is 6.4, hematocrit 43,  platelets 163,000.  Sodium 140, potassium 4.9.  BUN and creatinine are  24 and 1.5.  Cardiac catheterization and echo as previously noted.   IMPRESSION:  Kenneth Massey is a 77 year old gentleman who recently was  found to have significant mitral valve disease as well as moderate  tricuspid insufficiency and impaired left ventricular function on  echocardiography.  He is very difficult to get a read on in terms of his  symptoms.  He is a fairly stoic and seems to be minimizing to some  degree.  A cardiac catheterization, he was found to have a 60% left main  stenosis in the left dominant system with an occluded right coronary.  Coronary artery bypass grafting is indicated along with addressing the  mitral valve at the time of surgery even though the mitral valve in and  of itself likely would not be sufficient to warrant surgery.  It  definitely should be addressed at the time of bypass grafting.  He also  has moderate tricuspid insufficiency which would also need to be  assessed and possibly intervened on at the time of surgery as well.  I  have not had a chance to personally review the echocardiogram or the  catheterization.  We will obtain those films.  But based on Dr.  Darrold Massey' reports, there is no question that the patient needs surgery.   I had a long discussion with Mr. and Kenneth Massey regarding the  indications,  risks, benefits, and alternative treatments.  They  understand that he needs bypass grafting for left main disease.  They  understand that we will also address the mitral valve at the time of  surgery and most likely will be able to repair this.  However, there is  a possibility that the valve could need for placement and  that will not  be definitively known until the time of surgery.  We will also assess  the tricuspid valve and decide whether anything needs to be done with  that at the time of surgery as well.  They understand the general nature  of the procedure, need for general anesthesia, use of the heart and lung  machine, expected operative time and hospital stay, and overall  recovery.  They understands the risks of surgery include but are not  limited to death, stroke, MI, DVT, PE, bleeding, possible need for  transfusions, infections, as well as other organ system dysfunction  including respiratory, renal, hepatic, or GI complications, possible  heart block requiring pacemaker placement.  He understands and accepts  these risks, and agrees to proceed.  He does need  carotid Dopplers given his history of peripheral vascular disease as  well as coronary disease and we will obtain his cardiac catheterization  and echo films from [facility] and review those.  We have  scheduled him for surgery on Friday, June 25th, and he will be admitted  the day of surgery.      Kenneth Massey, M.D.  Electronically Signed     SCH/MEDQ  D:  10/16/2008  T:  10/16/2008  Job:  [id number]   cc:   Kenneth Millard, MD, PhD  Diona Fanti

## 2010-09-09 NOTE — Op Note (Signed)
NAME:  Kenneth Massey, Kenneth Massey NO.:  [id number]   MEDICAL RECORD NO.:  [id number]          PATIENT TYPE:  INP   LOCATION:  2314                         FACILITY:  [facility]   PHYSICIAN:  Salvatore Decent. Dorris Fetch, M.D.DATE OF BIRTH:  [birth year]   DATE OF PROCEDURE:  10/19/2008  DATE OF DISCHARGE:                               OPERATIVE REPORT   PREOPERATIVE DIAGNOSES:  Left main coronary disease, moderate mitral  regurgitation, moderate tricuspid regurgitation.   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   PROCEDURE:  Median sternotomy extracorporeal circulation, coronary  artery bypass grafting x3 (left internal mammary artery to LAD,  saphenous vein graft sequentially to obtuse marginal 1 and the left  posterior descending), endoscopic vein harvest right thigh, repair of  perforation of the anterior leaflet of the mitral valve with pericardial  patch.   SURGEON:  Salvatore Decent. Dorris Fetch, MD   ASSISTANT:  Sheliah Plane, MD   SECOND ASSISTANT:  Doree Fudge, PA   ANESTHESIA:  General.   FINDINGS:  Normal left ventricular function.  Good coronary targets,  good conduits, mild aortic insufficiency, mild mitral insufficiency,  moderate tricuspid insufficiency, fistulous communication, and mitral  annulus filling cavity between the left atrium and the aortic annulus  noted on echo and confirmed by transesophageal three-dimensional  echocardiography.  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   CLINICAL NOTE:  Kenneth Massey is a 77 year old gentleman who recently was  found on cardiac catheterization to have a 75% left main stenosis and  echocardiogram demonstrated moderate mitral regurgitation as well as  moderate  tricuspid regurgitation.  The patient was referred for coronary  artery bypass grafting, possible mitral valve repair or replacement.  The indications, risks, benefits, and alternatives were discussed in  detail with the patient.  He understood and accepted the risks and  agreed to proceed.   OPERATIVE NOTE:  Kenneth Massey was brought to the preoperative holding  area on October 19, 2008.  There lines were placed by Cardiology for serial  blood pressure monitoring as well as a Swan-Ganz catheter for  hemodynamic measurements.  Intravenous antibiotics were administered.  He was taken to the operating room, anesthetized, and intubated.  A  Foley catheter was placed and the chest, abdomen, and legs were prepped  and draped in usual fashion.  Transesophageal echocardiography was  performed by Dr. Laverle Hobby of the Anesthesia Service.  It revealed  moderate tricuspid regurgitation, 1+ aortic insufficiency, aortic  sclerosis without stenosis.  There was only mild mitral regurgitation  with unusual appearance of the anterior leaflet of the mitral valve.  A  question of fistulous communication from the left ventricle into a space  between the anterior leaflet and mitral annulus and left atrium and the  aortic annulus.  Dr. Kipp Brood was consulted for transesophageal 3D  echocardiography.  This confirmed a fistulous communication and the  cavity in the location mentioned, this was not a ventricular septal  defect, it was almost directly opposite of the membranous septum and was  located essentially at the commissure between the left and noncoronary  aortic valve cusp.  The mitral leaflets themselves were relatively  unremarkable and as noted he only had mild mitral regurgitation even  with provocative measures.   A median sternotomy was performed and the left internal mammary artery  was harvested using standard technique.  Simultaneously, incision was  made in the medial aspect of the right  leg at the level of knee.  The  greater saphenous vein was harvested endoscopically from the right leg  as the left mammary was being harvested.  A 2000 units of heparin was  administered during the vessel harvest.  The pericardium was opened.  There was some question based on the echocardiogram.  This could  potentially be sinus of Valsalva aneurysm.  None was noted on inspection  of the ascending aorta.  The remaining of the full heparin dose was  given after confirming adequate anticoagulation with ACT measurement.  Aorta was cannulated via concentric 2-0 Ethibond pledgeted pursestring  sutures.  Because of the potential need to open the left atrium, dual  stage venous cannulation was carried out.  A 31-French right-angle  cannula was placed via pursestring suture in the superior vena cava.  A  40-French right-angle cannula was placed via pursestring suture in the  inferior aspect of the right atrium and directed into the inferior vena  cava.  Cardiopulmonary bypass was instituted and the patient was cooled  to 28 degrees Celsius.  Coronary arteries were inspected and anastomotic  sites were chosen.  The conduits were inspected and cut to length.  A  left ventricular vent was placed via pursestring suture in the right  superior pulmonary vein.  Retrograde cardioplegic cannula was placed via  pursestring suture and the right atrium directed in the coronary sinus  and antegrade cardioplegic cannula was placed in the ascending aorta.  A  foam pad was placed in the pericardium to insulate the heart and protect  the left phrenic nerve.  A temperature probe was placed in myocardial  septum.   The aorta was crossclamped.  The left ventricle was emptied via the  aortic root vent.  Cardiac arrest then was achieved with a combination  of cold antegrade and retrograde blood cardioplegia and topical iced  saline.  Initial dose of cold antegrade cardioplegia was administered  500 mL was given.   There was a rapid diastolic arrest.  The remainder of  the cardioplegia was administered via the retrograde cannula.  This was  also 500 mL and the myocardial septal temperature fell to 9 degrees  Celsius.  Additional cardioplegia was administered at the completion of  the vein grafts as well as at 20-minute intervals during the  intracardiac portion of the procedure.   Following distal anastomoses were performed, first a reversed saphenous  vein graft was placed sequentially to obtuse marginal 1 and the left  posterior descending branch of the left circumflex coronary artery.  The  patient had a 60% left main stenosis with a dominant left circulation.  The vein graft was of good quality.  It was anastomosed side-to-side to  OM-1.  The distal end was anastomosed end-to-side to the posterior  descending branch.  Both anastomoses were performed with running 7-0  Prolene sutures.  Both were probed proximally and distally at the  completion to ensure patency.  Cardioplegia was administered.  There was  good flow and good  hemostasis.   Next, the left internal mammary artery was brought through a window in  the pericardium.  The distal end was beveled and was anastomosed end-to-  side to the distal LAD.  This LAD was a 1.5-mm good-quality target.  Anastomosis was performed with a running 8-0 Prolene suture.  At the  completion of the anastomosis, bulldog clamps were briefly removed from  the mammary artery.  Immediate rapid septal rewarming was noted.  The  bulldog clamps were placed.  The mammary pedicle was tacked to the  epicardial surface and the heart with 6-0 Prolene sutures.   Additional cardioplegia was administered.  An aortotomy was made.  The  aortic valve was inspected.  It was a trileaflet valve.  There was some  mild calcification particularly at the base of the noncoronary cusp, but  no significant aortic stenosis.  The leaflets were gently spread and  there was a perforation  in the midbody of the mitral anterior leaflet of  the mitral valve at its insertion to the annulus directly beneath the  left/noncoronary commissure.  This hole was 2-3 mm in diameter.  Cultures were taken.  There was some laminated thrombus within this  cavity.  The site of perforation was opened slightly to ensure that  there was no abscess or purulent material within it.  A portion of the  patient's pericardium was treated with glutaraldehyde, which was then  used as a patch to close the defect with a running 5-0 Prolene suture.  Again, cardioplegia was administered 20-minute intervals during this  portion of the procedure.  The aortotomy then was closed in two layers  as rewarming of the patient was done.  The first layer was a running 4-0  Prolene horizontal mattress suture followed by a running 4-0 Prolene  simple suture.  After closing the aortotomy, the vein graft was cut to  length.  Cardioplegic cannula was removed from the ascending aorta and  proximal vein graft anastomoses was performed to 4.5 mm punch aortotomy  with running 6-0 Prolene suture.  After completion of this anastomosis,  lidocaine was administered.  The patient was placed in Trendelenburg  position.  The bulldog clamps was removed from the left mammary artery.  Immediate rapid septal rewarming was noted.  The aortic root and left  ventricle were de-aired and the aortic crossclamp was removed.  Total  crossclamp time was 108 minutes.   The patient was in heart block initially, then fibrillated after a  single defibrillation with 20 joules.  He went back into the heart block  and remained into the heartblock thereafter.  Approach before the  completion of the procedure.  The aortotomy and all proximal and distal  anastomoses were inspected for hemostasis.  Epicardial pacing wires were  placed on the right ventricle and right atrium and DDD pacing was  initiated.  A low-dose dopamine infusion at 3 mcg/kg per minute  was  initiated.  The patient had rewarmed to a core temperature of 37 degrees  Celsius.  He was weaned from cardiopulmonary bypass without difficulty.  Postbypass 3D transesophageal echocardiography revealed no change and 1+  mitral insufficiency.  There was no residual flow into the cavity as  noted.  There was still 1+ aortic insufficiency unchanged from preop.  There was preserved left ventricular function.  The retrograde  cardioplegia cannula and left ventricular vent were removed.  Additional  de-airing of the left ventricle was performed.  The inferior vena cava  cannula was removed.  The patient then was finally completely weaned  from cardiopulmonary bypass.  Test dose protamine was administered and  was well tolerated.  The atrial aortic cannulae were removed.  The  remaining of the protamine was administered without incident.  Chest was  irrigated with warm normal saline.  Hemostasis was achieved.  The  pericardium was reapproximated over the ascending aorta with interrupted  3-0 silk sutures.  The rim of the pericardium could not be closed as a  portion had been resected for use for the patch.  A single mediastinal  and single left pleural chest tubes were placed through separate  subcostal incisions.  The sternum was closed with interrupted heavy  gauge stainless steel wires.  The pectoralis fascia, subcutaneous  tissue, and skin were closed in standard fashion.  All sponge, needle,  and instrument counts were correct at the end of the procedure.  The  patient remained hemodynamically stable throughout the post bypass  period and was taken from the operating room to the surgical intensive  care unit in good condition.      Salvatore Decent Dorris Fetch, M.D.  Electronically Signed     SCH/MEDQ  D:  10/19/2008  T:  10/20/2008  Job:  [id number]   cc:   Marcina Millard, MD  Diona Fanti

## 2010-09-09 NOTE — Op Note (Signed)
NAME:  Kenneth Massey, Kenneth Massey NO.:  [id number]   MEDICAL RECORD NO.:  [id number]          PATIENT TYPE:  INP   LOCATION:  2314                         FACILITY:  [facility]   PHYSICIAN:  Guadalupe Maple, M.D.  DATE OF BIRTH:  1930-04-17   DATE OF PROCEDURE:  10/19/2008  DATE OF DISCHARGE:                               OPERATIVE REPORT   SURGEON:  Guadalupe Maple, MD   PROCEDURE:  Intraoperative transesophageal echocardiography.    Mr. Kenneth Massey is a 77 year old male with coronary artery disease  and mitral regurgitation and tricuspid regurgitation.  He is scheduled  to undergo coronary artery bypass grafting and possible mitral valve  repair and were tricuspid valve repair as well.  I was called into the  operating room while the patient was under general endotracheal  anesthesia to assist with the transesophageal echocardiography.  The  echocardiography probe had been placed by Dr. Laverle Hobby.   IMPRESSION:  Pre-bypass findings:  1. Aortic valve.  The aortic valve was trileaflet.  However, there      appeared to be a cavity, which appeared to originate from the      posterior aorta and extended into the left atrium.  The superior      extent began in the area of the sinotubular ridge and it extended      out to the region below the aortic valve.  There appeared to be a      small communication between the left ventricular outflow tract      immediately below the aortic valve and this cavity.  There appeared      to be flow into the cavity in systole and into the left ventricular      outflow tract in diastole.  There was no obvious thrombus within      the cavity.  There were no vegetations noted on the aortic valve.      The cavity appeared to originate in the region of the left coronary      cusp and noncoronary cusps of the aortic valve and the left and      noncoronary sinuses of Valsalva.  The differential diagnosis of      this cavity included possible  healed abscess of the aortic annulus      or possible sinus of Valsalva aneurysm.  2. Aortic valve.  The aortic valve was trileaflet, and the leaflets      appeared to open normally and there was trace aortic insufficiency      noted with a central jet originating at the midpoint of the leaflet      coaptation.  3. Mitral valve.  The mitral leaflets appeared thickened but were not      prolapsing, and there was a central jet of mitral insufficiency,      which was graded as 1+.  4. Left ventricle.  There appeared to be vigorous contractility of the      left ventricle with ejection fraction estimated at 55-60%.  5. Tricuspid valve.  There appeared to be 1 to 2+ tricuspid  insufficiency with a central jet, but the tricuspid leaflets      otherwise appeared to be intact without vegetations or torn      chordae.   Post-bypass findings.  1. Ascending aorta.  The outpouching or cavity in the region of the      posterior aortic annulus and aortic root was present, but the      communication between the cavity and the aortic outflow tract could      no longer be seen with color Doppler interrogation.  2. Aortic valve.  The aortic valve was trileaflet, appeared to open      normally, and there again was trace aortic insufficiency, which is      unchanged from the pre-bypass study.  3. Mitral valve.  The mitral valve appeared to open well.  The      leaflets were moderately thickened, and there was      1+ mitral insufficiency, which was unchanged from the pre-bypass      study.  4. Left ventricle.  There was vigorous contractility of the left      ventricle, although views were somewhat limited and ejection      fraction was again estimated 55-60%.           ______________________________  Guadalupe Maple, M.D.     DCJ/MEDQ  D:  10/19/2008  T:  10/20/2008  Job:  [id number]

## 2010-09-09 NOTE — Discharge Summary (Signed)
NAME:  [name], [name] NO.:  [id number]   MEDICAL RECORD NO.:  [id number]          PATIENT TYPE:  INP   LOCATION:  2041                         FACILITY:  [facility]   PHYSICIAN:  Salvatore Decent. Dorris Fetch, M.D.DATE OF BIRTH:  [birth year]   DATE OF ADMISSION:  10/19/2008  DATE OF DISCHARGE:  10/29/2008                               DISCHARGE SUMMARY   This is an addendum to the previously dictated discharge summary on the  patient, Kenneth Massey.  Kenneth Massey was originally scheduled for  potential discharged home on October 27, 2008.  However, he was having  significant hypoglycemia requiring discontinuation of Lantus.  He also  was noted to be significantly volume overloaded with weight about 5 kg  above his preoperative weight and lower extremity edema on physical  exam.  His creatinine had remained stable and was trending downward at  1.5.  He was started on Lasix 40 mg daily to which he has responded  well.  Following the initiation of Lasix, his creatinine did bump up  slightly to 1.7, but has remained stable over the ensuing 48 hours.  He  is otherwise doing well from a cardiovascular standpoint.  He is  diuresing well and is down 2-3 kg since the Lasix was initiated.  His  blood sugars have stabilized on the Lantus and have been running in the  110-130 range.  He is otherwise doing well and is deemed ready for  discharge home at this time.  Discharge medications are unchanged from  the previously dictated discharge summary with these exceptions;  1. Lantus has been discontinued.  2. He will be started on Lasix 40 mg daily x1 week.  3. Potassium 20 mEq daily x1 week.   DISCHARGE INSTRUCTIONS:  Please see previously dictated discharge  summary for all discharge instructions including MD followups.      Coral Ceo, P.A.      Salvatore Decent Dorris Fetch, M.D.  Electronically Signed    GC/MEDQ  D:  10/29/2008  T:  10/30/2008  Job:  [id number]   cc:   [facility] Office

## 2010-09-09 NOTE — Assessment & Plan Note (Signed)
OFFICE VISIT   OMAIR, DETTMER  DOB:  02/11/1930                                        November 15, 2008  CHART #:  [id number]   REASON FOR VISIT:  Followup after coronary artery bypass grafting and  mitral valve repair.   The patient is a 77 year old gentleman, who underwent coronary artery  bypass grafting x3 and mitral valve repair on October 19, 2008.  He has  been found to have a 60% ostial left main stenosis and moderate mitral  regurgitation.  On TEE, his mitral regurgitation was actually quite  interesting because it was not so much into the left atrium as there was  a jet into a cavity through the anterior leaflet and this turned out to  be a cavity in the annulus which is likely have been an old healed  abscess.  We did a pericardial patch to the hole in the anterior  leaflet.  There was no residual flow into that and he had trace to 1+  residual mitral regurgitation.  He did well postoperatively.  He did  have some peripheral edema.  He also has a little bit of difficulty  managing his diabetes that subsequently resolved.  He had some acute  renal insufficiency which resolved and some atrial fibrillation which  was treated as well.  After discharge, he developed a rash on his leg.  He said he actually started noticing it before discharge, but had  worsened after he was sent home.  He was recently started on antibiotics  for that, which has improved it significantly.   He states he has soreness in his chest.  He does not have any chest pain  or shortness of breath.  His exercise tolerance is improving.  He is  anxious to increase his activities.   His current medications are glimepiride 8 mg daily, aspirin 325 mg  daily, Toprol-XL 25 mg daily, amiodarone 200 mg b.i.d., Lasix 40 mg  daily, metformin 500 mg b.i.d., potassium 20 mEq daily, enalapril 5 mg  daily, Neurontin 100 mg b.i.d., and Combivent p.r.n.   He has no known drug allergies.   PHYSICAL EXAMINATION:  GENERAL:  The patient is a 77 year old gentleman  in no acute distress.  VITAL SIGNS:  His blood pressure is 96/55, pulse 70, respirations 18,  and his oxygen saturation is 99% on room air.  LUNGS:  Clear with equal breath sounds bilaterally.  CARDIAC:  Regular rate and rhythm.  Normal S1 and S2.  I did not hear a  murmur.  CHEST:  Sternum is stable.  His sternal incision is clean, dry, and  intact.  EXTREMITIES:  His leg incisions are healing well.  He does have a  punctate rash on his lower extremities with some mild erythema which he  says has improved since starting antibiotics.  He is not sure of the  name of the antibiotic.   Chest x-ray shows good aeration of the lungs bilaterally.   IMPRESSION:  The patient is a 77 year old gentleman, who is now about a  month out from coronary artery bypass grafting for left main disease as  well as repair of a perforation in the anterior leaflet of the mitral  valve with a pericardial patch.  He is doing well symptomatically at  this time.  His exercise tolerance is improving.  He is feeling better  now that this rash and possible cellulitis seems to be resolving in his  legs.  He was started on amiodarone for some atrial fibrillation  postoperatively.  Assuming that he is in sinus rhythm today and assuming  that he does not have any further issues related to atrial fibrillation,  the amiodarone can be discontinued at [name]' discretion.  Otherwise, I am happy with his progress.  He was cautioned not to do any  heavy lifting for at least another 2-3 weeks.  He may begin driving  whenever he feels comfortable to do so.  He is not taking any narcotics  and has not in quite some time.  He will continue to be followed by Dr.  Clearance Coots and [name].  I would be happy to see him back anytime if I  can be of any further assistance with his care.   Salvatore Decent Dorris Fetch, M.D.  Electronically Signed   SCH/MEDQ  D:   11/15/2008  T:  11/16/2008  Job:  [id number]   cc:   Marcina Millard, MD  Diona Fanti

## 2010-12-29 IMAGING — CR DG CHEST 1V PORT
1 series · 1 of 1 positions shown · non-contrast
Comparison: none

REASON FOR EXAM: Shortness of Breath
COMMENTS:

[view not recorded]
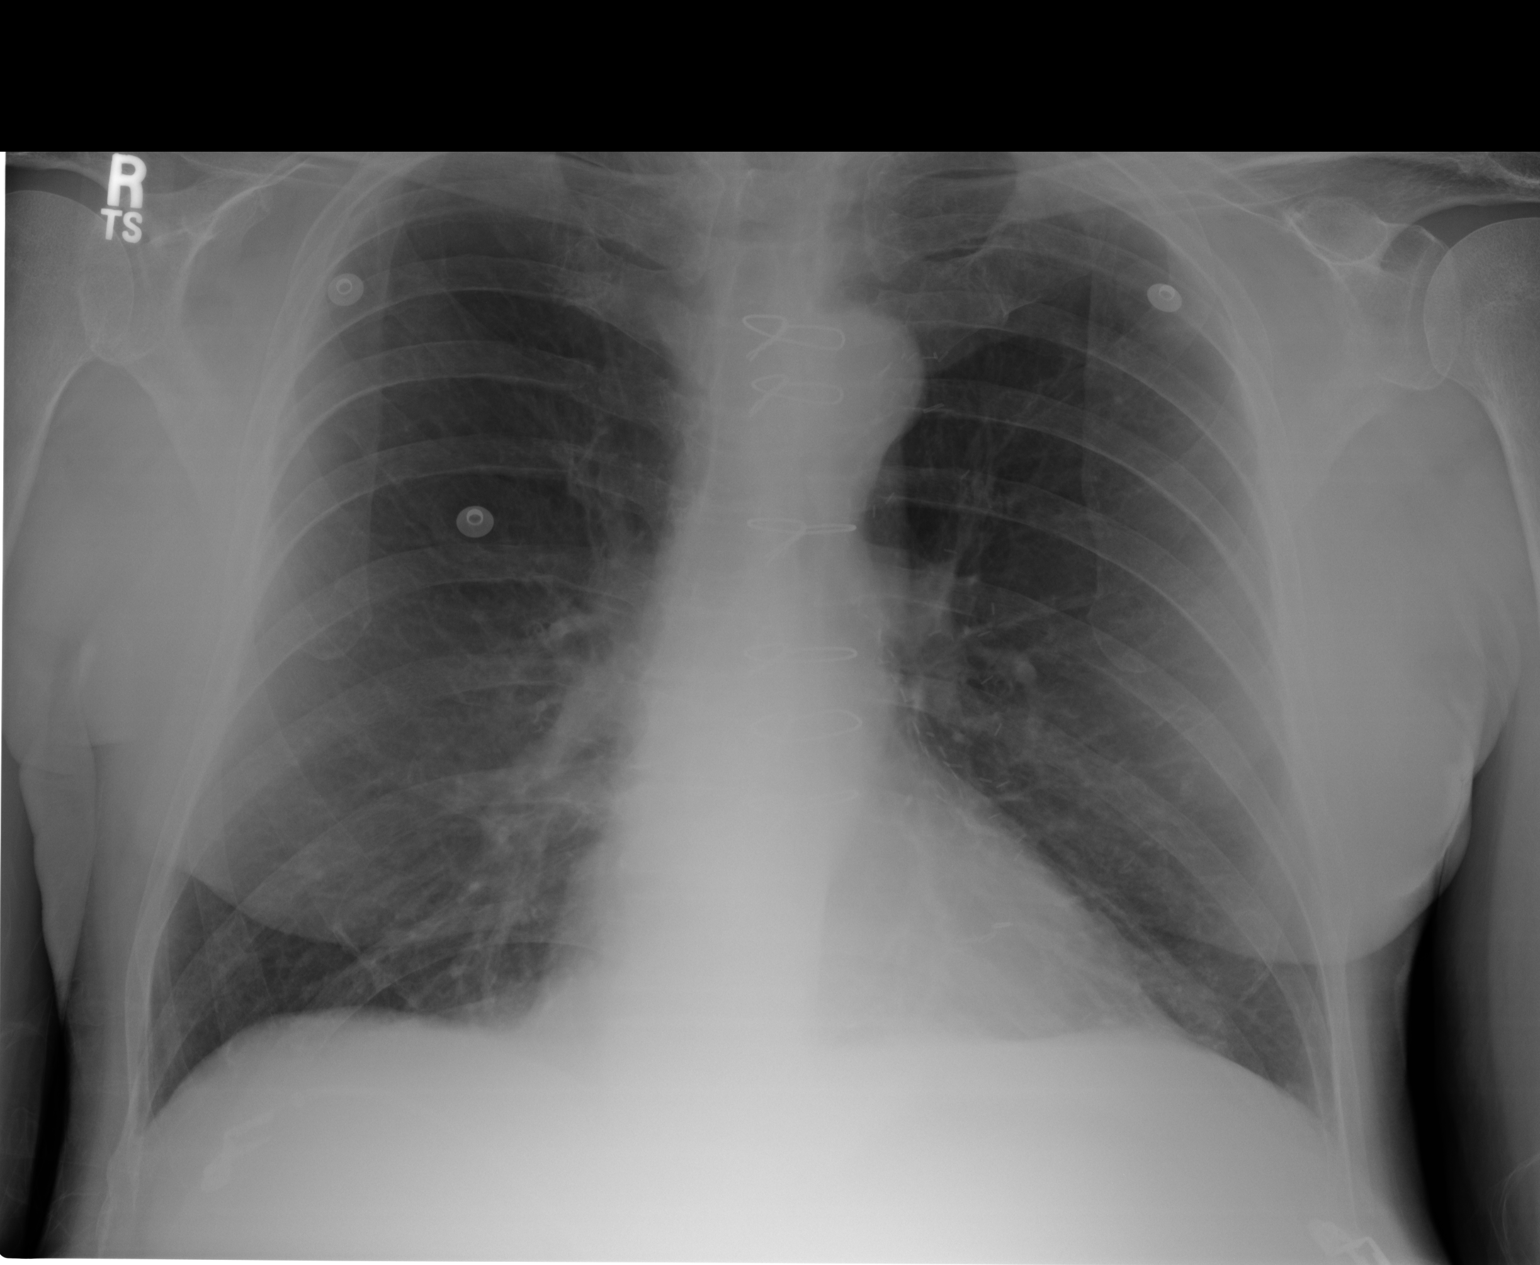

[1 of 1 positions shown; findings below may reference images not displayed]

PROCEDURE:     DXR - DXR PORTABLE CHEST SINGLE VIEW  - March 12, 2010  [DATE]

RESULT:     PET CT comparison is made to the study of 01/25/2010.

There is mild hyperinflation. Sternotomy wires are present. The lungs are
clear. The heart and pulmonary vessels are normal. The bony and mediastinal
structures are unremarkable. There is no effusion. There is no pneumothorax
or evidence of congestive failure.
IMPRESSION: No acute cardiopulmonary disease. Hyperinflation suggestive
of COPD.

## 2011-05-27 ENCOUNTER — Ambulatory Visit: Payer: Self-pay | Admitting: Vascular Surgery

## 2011-06-17 ENCOUNTER — Ambulatory Visit: Payer: Self-pay | Admitting: Vascular Surgery

## 2011-06-17 LAB — CBC
HCT: 38 % — ABNORMAL LOW (ref 40.0–52.0)
HGB: 12.8 g/dL — ABNORMAL LOW (ref 13.0–18.0)
MCH: 29.6 pg (ref 26.0–34.0)
MCHC: 33.6 g/dL (ref 32.0–36.0)

## 2011-06-17 LAB — BASIC METABOLIC PANEL
Calcium, Total: 8.8 mg/dL (ref 8.5–10.1)
Co2: 28 mmol/L (ref 21–32)
Glucose: 232 mg/dL — ABNORMAL HIGH (ref 65–99)
Osmolality: 285 (ref 275–301)
Sodium: 137 mmol/L (ref 136–145)

## 2011-06-24 ENCOUNTER — Inpatient Hospital Stay: Payer: Self-pay | Admitting: Vascular Surgery

## 2011-06-25 LAB — COMPREHENSIVE METABOLIC PANEL
Albumin: 2.9 g/dL — ABNORMAL LOW (ref 3.4–5.0)
Alkaline Phosphatase: 43 U/L — ABNORMAL LOW (ref 50–136)
BUN: 12 mg/dL (ref 7–18)
Calcium, Total: 8.2 mg/dL — ABNORMAL LOW (ref 8.5–10.1)
Creatinine: 1.3 mg/dL (ref 0.60–1.30)
Glucose: 106 mg/dL — ABNORMAL HIGH (ref 65–99)
Osmolality: 287 (ref 275–301)
Sodium: 144 mmol/L (ref 136–145)
Total Protein: 5.8 g/dL — ABNORMAL LOW (ref 6.4–8.2)

## 2011-06-25 LAB — CBC WITH DIFFERENTIAL/PLATELET
Basophil #: 0 10*3/uL (ref 0.0–0.1)
Basophil %: 0.3 %
Eosinophil #: 0 10*3/uL (ref 0.0–0.7)
HCT: 33.4 % — ABNORMAL LOW (ref 40.0–52.0)
HGB: 10.9 g/dL — ABNORMAL LOW (ref 13.0–18.0)
Lymphocyte %: 15.6 %
MCH: 29 pg (ref 26.0–34.0)
MCHC: 32.8 g/dL (ref 32.0–36.0)
MCV: 88 fL (ref 80–100)
Neutrophil %: 72.4 %
RDW: 14.5 % (ref 11.5–14.5)

## 2011-06-25 LAB — APTT: Activated PTT: 34.7 secs (ref 23.6–35.9)

## 2011-06-25 LAB — PROTIME-INR: INR: 1.1

## 2011-08-03 ENCOUNTER — Ambulatory Visit: Payer: Self-pay | Admitting: Vascular Surgery

## 2011-08-03 LAB — BASIC METABOLIC PANEL
Chloride: 103 mmol/L (ref 98–107)
Co2: 29 mmol/L (ref 21–32)
Creatinine: 1.36 mg/dL — ABNORMAL HIGH (ref 0.60–1.30)
EGFR (African American): >60
Potassium: 4.5 mmol/L (ref 3.5–5.1)
Sodium: 139 mmol/L (ref 136–145)

## 2014-08-19 NOTE — Op Note (Signed)
PATIENT NAME:  Kenneth Massey, Kenneth Massey MR#:  [id number] DATE OF BIRTH:  22-Sep-1929  DATE OF PROCEDURE:  06/24/2011  PREOPERATIVE DIAGNOSES:  1. Abdominal aortic aneurysm.  2. Hypertension.   POSTOPERATIVE DIAGNOSES:  1. Abdominal aortic aneurysm.  2. Hypertension.   PROCEDURES:  1. Right femoral artery cutdown for placement of endoprosthesis, by Dr. Levora DredgeGregory Schnier. 2. Catheter placement into aorta from left femoral approach, by Dr. Festus BarrenJason Dew.  3. Catheter placement into left femoral artery from right femoral approach, by Dr. Gilda CreaseSchnier. 4. Placement of an Endologix Unibody endoprosthesis with a 22 mm diameter proximal x 13 mm diameter distal device. 5. Placement of an aortic extension cuff x2, by Dr. Gilda CreaseSchnier.  SURGEON: Festus BarrenJason Dew, MD  CO-SURGEON: Levora DredgeGregory Schnier, MD  ANESTHESIA: General.   ESTIMATED BLOOD LOSS: Approximately 50 mL.  FLUOROSCOPY TIME: 14 minutes.   CONTRAST USED: 30 mL.   INDICATION FOR PROCEDURE: This is a 81 year old gentleman with abdominal aortic aneurysm. It has reached threshold size for prophylactic repair and had appropriate anatomy for endovascular repair. Risks and benefits were discussed and informed consent was obtained.   DESCRIPTION OF PROCEDURE: The patient was brought to the vascular interventional radiology suite with help from our anesthesia colleagues providing a general anesthetic. His groins and abdomen were sterilely prepped and draped and a sterile surgical field was created. I gained percutaneous access of the left femoral artery without difficulty while Dr. Gilda CreaseSchnier did a right femoral artery cutdown. The main sheath and endoprosthesis was placed from the right femoral approach. I put a snare catheter up the left femoral approach, snared the contralateral wire from Dr. Gilda CreaseSchnier, and we advanced the contralateral wire and catheter out the left femoral sheath. We then parked the device on the aortic bifurcation and deployed this in the typical fashion. I  rewired with an 0.014 wire prior to deploying the left-sided limb and then placed a pigtail catheter into the aorta. Dr. Gilda CreaseSchnier delivered the aortic extension cuff. A 25 mm diameter x 75 mm in length infrarenal extension deployed this and then ironed out with the Coda balloon. Completion angiogram performed through the pigtail catheter, from the left femoral approach, showed it to be approximately 1 cm below the renal arteries prior to the stent and a second extension cuff was selected. This was a 25 mm diameter suprarenal extender cuff that was deployed at the base of the right renal artery being lower with the fabric portion of the device. This was ironed out with the [place] balloon. I used a 10 mm diameter balloon, on the left iliac limb, while Dr. Gilda CreaseSchnier used the [place] balloon on the right. Angiogram at completion showed a very minimal type II endoleak that was very late. No type I or III endoleaks were seen and good flow was seen with patent renal arteries. At this point, we elected to terminate the procedure. A Mynx closure device was deployed on the left femoral artery, Dr. Gilda CreaseSchnier closed the right femoral arteriotomy with a series of interrupted 6-0 Prolene, and then a layered closure with 2-0 and 3-0 Vicryl and 4-0 Monocryl. A sterile dressing was placed. The patient was awakened from anesthesia and taken to the recovery room in stable condition.  ____________________________ Annice NeedyJason S. Dew, MD jsd:slb D: 06/24/2011 09:50:58 ET T: 06/24/2011 10:07:05 ET JOB#: [id number]  cc: Annice NeedyJason S. Dew, MD, <Dictator> Annice NeedyJASON S DEW MD ELECTRONICALLY SIGNED 06/24/2011 10:26

## 2014-08-19 NOTE — Op Note (Signed)
PATIENT NAME:  Kenneth Massey, Kenneth Massey MR#:  [id number] DATE OF BIRTH:  20-Apr-1930  DATE OF PROCEDURE:  08/03/2011  PREOPERATIVE DIAGNOSES:  1. Right groin wound infection status post endovascular abdominal aortic aneurysm repair.  2. Abdominal aortic aneurysm.  3. Chronic obstructive pulmonary disease.  4. Hypertension.  5. Hyperlipidemia.  6. Diabetes.   POSTOPERATIVE DIAGNOSES:  1. Right groin wound infection status post endovascular abdominal aortic aneurysm repair.  2. Abdominal aortic aneurysm.  3. Chronic obstructive pulmonary disease.  4. Hypertension.  5. Hyperlipidemia.  6. Diabetes.   PROCEDURE: Debridement of skin and soft tissue less than 25 sq cm right groin with VAC dressing placement.   SURGEON: Annice NeedyJason S. Cleave Ternes, MD  ANESTHESIA: MAC.   ESTIMATED BLOOD LOSS: Minimal.   INDICATION FOR PROCEDURE: 81 year old white male with a right groin wound infection. He is several weeks status post an endovascular abdominal aortic aneurysm repair. He presented to the clinic Friday and had purulent drainage. This was opened in the office and pus was evacuated. He is brought down to the Operating Room for formal irrigation and debridement and with plan of a VAC dressing placement. Risks and benefits were discussed. Informed consent was obtained.   DESCRIPTION OF PROCEDURE: Patient is brought to the operative suite and after an adequate level of intravenous sedation was obtained, his right groin was sterilely prepped and draped, a sterile surgical field was created. The existing dressing had been removed. The wound was opened inferiorly approximately 1 to 2 cm and superiorly about 0.5 to 1 cm as there is some tunneling caudad and cephalad. There was some fibrinous exudate on the soft tissue and the skin that was sharply debrided with a scalpel and Metzenbaum scissors. There was not a significant amount of residual purulent drainage at this time. The wound was then copiously irrigated with sterile  saline and a small VAC sponge was cut to fit the wound. A good occlusive seal was obtained without significant leakage when the suction tubing was connected to the suction canister. The patient is awake from anesthesia and taken to the recovery room in stable condition having tolerated procedure well.    ____________________________ Annice NeedyJason S. Ricci Paff, MD jsd:cms D: 08/03/2011 12:06:00 ET T: 08/03/2011 16:31:18 ET JOB#: [id number]  cc: Annice NeedyJason S. Taylormarie Register, MD, <Dictator> Barbette ReichmannVishwanath Hande, MD  Annice NeedyJASON S Lavonia Eager MD ELECTRONICALLY SIGNED 08/06/2011 12:01

## 2014-08-19 NOTE — Op Note (Signed)
PATIENT NAME:  Kenneth Massey, Kenneth Massey MR#:  [id number] DATE OF BIRTH:  1930-04-16  DATE OF PROCEDURE:  06/24/2011  PREOPERATIVE DIAGNOSIS: Abdominal aortic aneurysm.   POSTOPERATIVE DIAGNOSIS: Abdominal aortic aneurysm.  PROCEDURES PERFORMED: Endovascular repair of infrarenal abdominal aortic aneurysm with an Endologix device.   SURGEON: Renford DillsGregory G. Nino Amano, MD  CO-SURGEON: Annice NeedyJason S. Dew, MD   ANESTHESIA: General by endotracheal intubation.   FLUIDS: Per anesthesia record.   ESTIMATED BLOOD LOSS: Approximately 50 mL.   CONTRAST USED: Isovue 30 mL.   FLUOROSCOPY TIME: 14 minutes.   INDICATIONS: Mr. Kenneth Massey is a 81 year old gentleman who presented to the office with a known abdominal aortic aneurysm. Previously he had been told he did not need repair, however, evaluation in the office demonstrated he had greater than a 6 cm abdominal aortic aneurysm and endovascular repair was advised. The risks and benefits were reviewed. All questions are answered. The patient has agreed to proceed.   DESCRIPTION OF PROCEDURE: With Dr. Wyn Quakerew and I working simultaneously, Dr. Wyn Quakerew on the left side and myself on the right, the patient is placed under general anesthesia. Appropriate invasive monitors are placed, and subsequently he is prepped and draped in a sterile fashion from his upper abdomen to his knees. A vertical incision is then made in the right groin by myself and the dissection is carried down to expose the common femoral artery, which was looped proximally and distally. There appeared to be three dominant branches, likely the SFA,  the profunda and a large collateral. These were all individually looped around their origins. On the left side, Dr. Wyn Quakerew accessed the common femoral artery using percutaneous techniques with a Seldinger needle. He then advanced the guidewire, and an 8-French sheath is introduced. On the right side, direct puncture of the femoral artery is made with a Seldinger needle and wire is  advanced and an 8-French sheath is introduced.   The J-wire is then advanced up the right side and subsequently a pigtail catheter.  The pigtail catheter is then utilized to exchange for the Lunderquist wire. A 17-French sheath is then exchanged on the right for the 8-French sheath, and it is loaded with a 22 x 80, 13 x 40 bifurcated device. The short pass wire is then advanced through the sheath, and with a snare up the left side the wire is captured and then pulled out the left sheath. The bifurcated device is then advanced with Dr. Wyn Quakerew taking care to keep the contralateral limb from wrapping, and subsequently it is opened and seated directly onto the aortic bifurcation. The left limb is then completely deployed, and the 0.014 [place] Slam wire advanced through the contralateral limb wire hypotube and then up into the main body. The right limb is then deployed.   The AFX sheath is then advanced through the bifurcated portion of the graft, and a 25 x 75 infrarenal extension cuff is advanced. It is then flared and deployed from the right groin just below the renal. A Coda balloon is then used to angioplasty the two portions as well as the right iliac limb, and a 10 x 4 balloon is used to angioplasty the left limb. Follow-up imaging demonstrates that the extension cuff has come down. There is approximately 8 mm from the top of the extension cuff to the right renal artery stent, and there does appear to be a type I endoleak. Therefore, a second extension cuff, a 25 x 75, with a 20 suprarenal stent is advanced over the stiff  wire from the right and flared at the level of the renal and then completely deployed. A Coda balloon is then used to post dilate this stent as well, and follow-up imaging demonstrates that there is exact positioning of the second extension cuff with the fabric beginning just at the level of the right renal stent's inferior margin. A bolus injection of contrast is then utilized to run through and  demonstrates that there are no longer any endoleaks, and there is rapid flow of contrast into both iliacs.   Oblique view of the left groin is obtained, and an Angio-Seal device is deployed without difficulty with good hemostasis.   The sheath is then removed on the right. The wire is then removed. The femoral artery is repaired with five interrupted 6-0 Prolene sutures. After verification of hemostasis, the right groin incision is closed in multiple layers using 2-0 and 3-0 Vicryl; 4-0 Monocryl subcuticular and Dermabond is applied to the skin.    The patient tolerated the procedure well, and there were no immediate complications. Sponge and needle counts were correct x2.  He is taken to the recovery room in stable condition.    ____________________________ Renford Dills, MD ggs:cbb D: 06/25/2011 18:28:00 ET T: 06/26/2011 10:04:56 ET JOB#: [id number]  cc: Renford Dills, MD, <Dictator> Renford Dills MD ELECTRONICALLY SIGNED 07/03/2011 10:48

## 2014-09-24 ENCOUNTER — Ambulatory Visit
Admission: EM | Admit: 2014-09-24 | Discharge: 2014-09-24 | Disposition: A | Discharge status: Home / Self Care | Payer: Medicare Other | Attending: Family Medicine | Admitting: Family Medicine

## 2014-09-24 ENCOUNTER — Ambulatory Visit: Payer: Medicare Other

## 2014-09-24 DIAGNOSIS — Z87891 Personal history of nicotine dependence: Secondary | ICD-10-CM | POA: Insufficient documentation

## 2014-09-24 DIAGNOSIS — J441 Chronic obstructive pulmonary disease with (acute) exacerbation: Secondary | ICD-10-CM | POA: Insufficient documentation

## 2014-09-24 DIAGNOSIS — Z7982 Long term (current) use of aspirin: Secondary | ICD-10-CM | POA: Diagnosis not present

## 2014-09-24 DIAGNOSIS — R0602 Shortness of breath: Secondary | ICD-10-CM | POA: Diagnosis not present

## 2014-09-24 DIAGNOSIS — Z79899 Other long term (current) drug therapy: Secondary | ICD-10-CM | POA: Diagnosis not present

## 2014-09-24 DIAGNOSIS — R05 Cough: Secondary | ICD-10-CM | POA: Diagnosis present

## 2014-09-24 MED ORDER — ALBUTEROL SULFATE HFA 108 (90 BASE) MCG/ACT IN AERS: 2.0000 | INHALATION_SPRAY | RESPIRATORY_TRACT | Status: AC | PRN

## 2014-09-24 MED ORDER — IPRATROPIUM-ALBUTEROL 0.5-2.5 (3) MG/3ML IN SOLN
3.0000 mL | Freq: Once | RESPIRATORY_TRACT | Status: AC
  Administered 2014-09-24: 3 mL via RESPIRATORY_TRACT

## 2014-09-24 MED ORDER — PREDNISONE 10 MG (21) PO TBPK: ORAL_TABLET | ORAL | Status: DC

## 2014-09-24 MED ORDER — AZITHROMYCIN 250 MG PO TABS: ORAL_TABLET | ORAL | Status: DC

## 2014-09-24 NOTE — ED Provider Notes (Addendum)
CSN: [id number]     Arrival date & time 09/24/14  0944 History   First MD Initiated Contact with Patient 09/24/14 1044     Chief Complaint  Patient presents with  . Cough   (Consider location/radiation/quality/duration/timing/severity/associated sxs/prior Treatment) Patient is a 81 y.o. male presenting with cough. The history is provided by the patient. No language interpreter was used.  Cough Cough characteristics:  Harsh, paroxysmal and non-productive Severity:  Moderate Timing:  Sporadic Progression:  Waxing and waning Chronicity:  Recurrent Smoker: no (former smoker)   Relieved by:  Nothing Worsened by:  Deep breathing and lying down Ineffective treatments: amoxicillin and tessalon perles  Associated symptoms: shortness of breath   Risk factors: no chemical exposure, no recent infection and no recent travel    Patient and his wife have some discrepancy as far as how long he's been sick and how bad he's been sick. I think that he underestimates the duration and severity of how long he has been sick. He is associated the lack of improvement to an allergy to his amoxicillin as Dr. started last week. He is also taking LawyerTessalon Perles. The only dose of amoxicillin that he's noticed was today. I've explained to him that if he had an allergy to amoxicillin his breathing would've been and became a lot worse and that this sounds more as if he's had poor efficacy with the amoxicillin. History reviewed. No pertinent past medical history. History reviewed. No pertinent past surgical history. History reviewed. No pertinent family history. History  Substance Use Topics  . Smoking status: Not on file  . Smokeless tobacco: Not on file  . Alcohol Use: Not on file    Review of Systems  Respiratory: Positive for cough and shortness of breath.     Allergies  Review of patient's allergies indicates no known allergies.  Home Medications   Prior to Admission medications   Medication Sig Start  Date End Date Taking? Authorizing Provider  amoxicillin (AMOXIL) 875 MG tablet Take 875 mg by mouth 2 (two) times daily.   Yes Historical Provider, MD  aspirin EC 81 MG tablet Take 81 mg by mouth daily.   Yes Historical Provider, MD  benzonatate (TESSALON) 200 MG capsule Take 200 mg by mouth 3 (three) times daily as needed for cough.   Yes Historical Provider, MD  carvedilol (COREG) 6.25 MG tablet Take 6.25 mg by mouth 2 (two) times daily with a meal.   Yes Historical Provider, MD  fluticasone-salmeterol (ADVAIR HFA) 115-21 MCG/ACT inhaler Inhale 2 puffs into the lungs 2 (two) times daily.   Yes Historical Provider, MD  glimepiride (AMARYL) 4 MG tablet Take 8 mg by mouth daily with breakfast.   Yes Historical Provider, MD  Multiple Vitamins-Minerals (PRESERVISION AREDS 2 PO) Take by mouth.   Yes Historical Provider, MD  albuterol (PROVENTIL HFA;VENTOLIN HFA) 108 (90 BASE) MCG/ACT inhaler Inhale 2 puffs into the lungs every 4 (four) hours as needed for wheezing or shortness of breath. 09/24/14   Hassan RowanEugene Oluwatimileyin Vivier, MD  azithromycin (ZITHROMAX Z-PAK) 250 MG tablet Take 2 tablets first day and then 1 po a day for 4 days 09/24/14   Hassan RowanEugene Trevionne Advani, MD  predniSONE (STERAPRED UNI-PAK 21 TAB) 10 MG (21) TBPK tablet All pills to be taken orally. 6 tablets day 1, 5 tablets daily 2, 4 tablets day 3, 3 tablets day 4, 2 tablets day 5, 1 tablet day 6 day 09/24/14   Hassan RowanEugene Wanell Lorenzi, MD   BP 153/88 mmHg  Pulse 90  Temp(Src) 96.7 F (35.9 C) (Tympanic)  Resp 18  Ht 6' (1.829 m)  Wt 164 lb (74.39 kg)  BMI 22.24 kg/m2  SpO2 98% Physical Exam  Constitutional: He is oriented to person, place, and time.  Elderly W M  HENT:  Head: Normocephalic and atraumatic.  Right Ear: External ear normal.  Left Ear: External ear normal.  Mouth/Throat: Oropharynx is clear and moist.  Eyes: Pupils are equal, round, and reactive to light.  Neck: Neck supple. No tracheal deviation present. No thyromegaly present.  Cardiovascular: Normal  rate.   Pulmonary/Chest: No respiratory distress. He has decreased breath sounds. He exhibits no mass and no tenderness.  Musculoskeletal: He exhibits no edema.  Neurological: He is alert and oriented to person, place, and time.  Skin: Skin is warm.    ED Course  Procedures (including critical care time) Labs Review Labs Reviewed - No data to display  Imaging Review No results found.  Duoneb given w/improvement of his breathing. MDM   1. SOB (shortness of breath)   2. Chronic obstructive pulmonary disease with acute exacerbation        Hassan Rowan, MD 09/26/14 1943  Hassan Rowan, MD 10/15/14 [phone]

## 2014-09-24 NOTE — Discharge Instructions (Signed)
Recommend stop the amoxicillin. Call your pulmonologist tomorrow regarding to aerosol treatments at home. Please use albuterol inhaler as needed and Advair Diskus as prescribed regularly twice a day.  Asthma Asthma is a recurring condition in which the airways tighten and narrow. Asthma can make it difficult to breathe. It can cause coughing, wheezing, and shortness of breath. Asthma episodes, also called asthma attacks, range from minor to life-threatening. Asthma cannot be cured, but medicines and lifestyle changes can help control it. CAUSES Asthma is believed to be caused by inherited (genetic) and environmental factors, but its exact cause is unknown. Asthma may be triggered by allergens, lung infections, or irritants in the air. Asthma triggers are different for each person. Common triggers include:   Animal dander.  Dust mites.  Cockroaches.  Pollen from trees or grass.  Mold.  Smoke.  Air pollutants such as dust, household cleaners, hair sprays, aerosol sprays, paint fumes, strong chemicals, or strong odors.  Cold air, weather changes, and winds (which increase molds and pollens in the air).  Strong emotional expressions such as crying or laughing hard.  Stress.  Certain medicines (such as aspirin) or types of drugs (such as beta-blockers).  Sulfites in foods and drinks. Foods and drinks that may contain sulfites include dried fruit, potato chips, and sparkling grape juice.  Infections or inflammatory conditions such as the flu, a cold, or an inflammation of the nasal membranes (rhinitis).  Gastroesophageal reflux disease (GERD).  Exercise or strenuous activity. SYMPTOMS Symptoms may occur immediately after asthma is triggered or many hours later. Symptoms include:  Wheezing.  Excessive nighttime or early morning coughing.  Frequent or severe coughing with a common cold.  Chest tightness.  Shortness of breath. DIAGNOSIS  The diagnosis of asthma is made by a  review of your medical history and a physical exam. Tests may also be performed. These may include:  Lung function studies. These tests show how much air you breathe in and out.  Allergy tests.  Imaging tests such as X-rays. TREATMENT  Asthma cannot be cured, but it can usually be controlled. Treatment involves identifying and avoiding your asthma triggers. It also involves medicines. There are 2 classes of medicine used for asthma treatment:   Controller medicines. These prevent asthma symptoms from occurring. They are usually taken every day.  Reliever or rescue medicines. These quickly relieve asthma symptoms. They are used as needed and provide short-term relief. Your health care provider will help you create an asthma action plan. An asthma action plan is a written plan for managing and treating your asthma attacks. It includes a list of your asthma triggers and how they may be avoided. It also includes information on when medicines should be taken and when their dosage should be changed. An action plan may also involve the use of a device called a peak flow meter. A peak flow meter measures how well the lungs are working. It helps you monitor your condition. HOME CARE INSTRUCTIONS   Take medicines only as directed by your health care provider. Speak with your health care provider if you have questions about how or when to take the medicines.  Use a peak flow meter as directed by your health care provider. Record and keep track of readings.  Understand and use the action plan to help minimize or stop an asthma attack without needing to seek medical care.  Control your home environment in the following ways to help prevent asthma attacks:  Do not smoke. Avoid being exposed  to secondhand smoke.  Change your heating and air conditioning filter regularly.  Limit your use of fireplaces and wood stoves.  Get rid of pests (such as roaches and mice) and their droppings.  Throw away  plants if you see mold on them.  Clean your floors and dust regularly. Use unscented cleaning products.  Try to have someone else vacuum for you regularly. Stay out of rooms while they are being vacuumed and for a short while afterward. If you vacuum, use a dust mask from a hardware store, a double-layered or microfilter vacuum cleaner bag, or a vacuum cleaner with a HEPA filter.  Replace carpet with wood, tile, or vinyl flooring. Carpet can trap dander and dust.  Use allergy-proof pillows, mattress covers, and box spring covers.  Wash bed sheets and blankets every week in hot water and dry them in a dryer.  Use blankets that are made of polyester or cotton.  Clean bathrooms and kitchens with bleach. If possible, have someone repaint the walls in these rooms with mold-resistant paint. Keep out of the rooms that are being cleaned and painted.  Wash hands frequently. SEEK MEDICAL CARE IF:   You have wheezing, shortness of breath, or a cough even if taking medicine to prevent attacks.  The colored mucus you cough up (sputum) is thicker than usual.  Your sputum changes from clear or white to yellow, green, gray, or bloody.  You have any problems that may be related to the medicines you are taking (such as a rash, itching, swelling, or trouble breathing).  You are using a reliever medicine more than 2-3 times per week.  Your peak flow is still at 50-79% of your personal best after following your action plan for 1 hour.  You have a fever. SEEK IMMEDIATE MEDICAL CARE IF:   You seem to be getting worse and are unresponsive to treatment during an asthma attack.  You are short of breath even at rest.  You get short of breath when doing very little physical activity.  You have difficulty eating, drinking, or talking due to asthma symptoms.  You develop chest pain.  You develop a fast heartbeat.  You have a bluish color to your lips or fingernails.  You are light-headed, dizzy, or  faint.  Your peak flow is less than 50% of your personal best. MAKE SURE YOU:   Understand these instructions.  Will watch your condition.  Will get help right away if you are not doing well or get worse. Document Released: 04/13/2005 Document Revised: 08/28/2013 Document Reviewed: 11/10/2012 [facility] Patient Information 2015 [place], [place]. This information is not intended to replace advice given to you by your health care provider. Make sure you discuss any questions you have with your health care provider.  Chronic Obstructive Pulmonary Disease Exacerbation  Chronic obstructive pulmonary disease (COPD) is a common lung problem. In COPD, the flow of air from the lungs is limited. COPD exacerbations are times that breathing gets worse and you need extra treatment. Without treatment they can be life threatening. If they happen often, your lungs can become more damaged. HOME CARE  Do not smoke.  Avoid tobacco smoke and other things that bother your lungs.  If given, take your antibiotic medicine as told. Finish the medicine even if you start to feel better.  Only take medicines as told by your doctor.  Drink enough fluids to keep your pee (urine) clear or pale yellow (unless your doctor has told you not to).  Use a  cool mist machine (vaporizer).  If you use oxygen or a machine that turns liquid medicine into a mist (nebulizer), continue to use them as told.  Keep up with shots (vaccinations) as told by your doctor.  Exercise regularly.  Eat healthy foods.  Keep all doctor visits as told. GET HELP RIGHT AWAY IF:  You are very short of breath and it gets worse.  You have trouble talking.  You have bad chest pain.  You have blood in your spit (sputum).  You have a fever.  You keep throwing up (vomiting).  You feel weak, or you pass out (faint).  You feel confused.  You keep getting worse. MAKE SURE YOU:   Understand these instructions.  Will watch your  condition.  Will get help right away if you are not doing well or get worse. Document Released: 04/02/2011 Document Revised: 02/01/2013 Document Reviewed: 12/16/2012 [facility] Patient Information 2015 [place]. This information is not intended to replace advice given to you by your health care provider. Make sure you discuss any questions you have with your health care provider.  Metered Dose Inhaler (No Spacer Used) Inhaled medicines are the basis of treatment for asthma and other breathing problems. Inhaled medicine can only be effective if used properly. Good technique assures that the medicine reaches the lungs. Metered dose inhalers (MDIs) are used to deliver a variety of inhaled medicines. These include quick relief or rescue medicines (such as bronchodilators) and controller medicines (such as corticosteroids). The medicine is delivered by pushing down on a metal canister to release a set amount of spray. If you are using different kinds of inhalers, use your quick relief medicine to open the airways 10-15 minutes before using a steroid, if instructed to do so by your health care provider. If you are unsure which inhalers to use and the order of using them, ask your health care provider, nurse, or respiratory therapist. HOW TO USE THE INHALER  Remove the cap from the inhaler.  If you are using the inhaler for the first time, you will need to prime it. Shake the inhaler for 5 seconds and release four puffs into the air, away from your face. Ask your health care provider or pharmacist if you have questions about priming your inhaler.  Shake the inhaler for 5 seconds before each breath in (inhalation).  Position the inhaler so that the top of the canister faces up.  Put your index finger on the top of the medicine canister. Your thumb supports the bottom of the inhaler.  Open your mouth.  Either place the inhaler between your teeth and place your lips tightly around the mouthpiece,  or hold the inhaler 1-2 inches away from your open mouth. If you are unsure of which technique to use, ask your health care provider.  Breathe out (exhale) normally and as completely as possible.  Press the canister down with the index finger to release the medicine.  At the same time as the canister is pressed, inhale deeply and slowly until your lungs are completely filled. This should take 4-6 seconds. Keep your tongue down.  Hold the medicine in your lungs for 5-10 seconds (10 seconds is best). This helps the medicine get into the small airways of your lungs.  Breathe out slowly, through pursed lips. Whistling is an example of pursed lips.  Wait at least 1 minute between puffs. Continue with the above steps until you have taken the number of puffs your health care provider has ordered.  Do not use the inhaler more than your health care provider directs you to.  Replace the cap on the inhaler.  Follow the directions from your health care provider or the inhaler insert for cleaning the inhaler. If you are using a steroid inhaler, after your last puff, rinse your mouth with water, gargle, and spit out the water. Do not swallow the water. AVOID:  Inhaling before or after starting the spray of medicine. It takes practice to coordinate your breathing with triggering the spray.  Inhaling through the nose (rather than the mouth) when triggering the spray. HOW TO DETERMINE IF YOUR INHALER IS FULL OR NEARLY EMPTY You cannot know when an inhaler is empty by shaking it. Some inhalers are now being made with dose counters. Ask your health care provider for a prescription that has a dose counter if you feel you need that extra help. If your inhaler does not have a counter, ask your health care provider to help you determine the date you need to refill your inhaler. Write the refill date on a calendar or your inhaler canister. Refill your inhaler 7-10 days before it runs out. Be sure to keep an adequate  supply of medicine. This includes making sure it has not expired, and making sure you have a spare inhaler. SEEK MEDICAL CARE IF:  Symptoms are only partially relieved with your inhaler.  You are having trouble using your inhaler.  You experience an increase in phlegm. SEEK IMMEDIATE MEDICAL CARE IF:  You feel little or no relief with your inhalers. You are still wheezing and feeling shortness of breath, tightness in your chest, or both.  You have dizziness, headaches, or a fast heart rate.  You have chills, fever, or night sweats.  There is a noticeable increase in phlegm production, or there is blood in the phlegm. MAKE SURE YOU:  Understand these instructions.  Will watch your condition.  Will get help right away if you are not doing well or get worse. Document Released: 02/08/2007 Document Revised: 08/28/2013 Document Reviewed: 09/29/2012 [facility] Patient Information 2015 [place], [place]. This information is not intended to replace advice given to you by your health care provider. Make sure you discuss any questions you have with your health care provider.  Shortness of Breath Shortness of breath means you have trouble breathing. Shortness of breath needs medical care right away. HOME CARE   Do not smoke.  Avoid being around chemicals or things (paint fumes, dust) that may bother your breathing.  Rest as needed. Slowly begin your normal activities.  Only take medicines as told by your doctor.  Keep all doctor visits as told. GET HELP RIGHT AWAY IF:   Your shortness of breath gets worse.  You feel lightheaded, pass out (faint), or have a cough that is not helped by medicine.  You cough up blood.  You have pain with breathing.  You have pain in your chest, arms, shoulders, or belly (abdomen).  You have a fever.  You cannot walk up stairs or exercise the way you normally do.  You do not get better in the time expected.  You have a hard time doing normal  activities even with rest.  You have problems with your medicines.  You have any new symptoms. MAKE SURE YOU:  Understand these instructions.  Will watch your condition.  Will get help right away if you are not doing well or get worse. Document Released: 09/30/2007 Document Revised: 04/18/2013 Document Reviewed: 06/29/2011 [employer] Patient Information 2015  [employer]. This information is not intended to replace advice given to you by your health care provider. Make sure you discuss any questions you have with your health care provider. You may stop the amoxicillin and start the new anabolic Zithromax as well as prednisone.

## 2014-09-24 NOTE — ED Notes (Signed)
Started 2 weeks ago with cough and PMD called in Rx for Tessalon Perles and Amoxicillin. Cough worse and 'hard to breathe at night when I lay on back or side. Denies fever

## 2014-09-30 ENCOUNTER — Emergency Department
Admission: EM | Admit: 2014-09-30 | Discharge: 2014-09-30 | Disposition: A | Discharge status: Home / Self Care | Payer: Medicare Other | Attending: Emergency Medicine | Admitting: Emergency Medicine

## 2014-09-30 ENCOUNTER — Other Ambulatory Visit: Payer: Self-pay

## 2014-09-30 ENCOUNTER — Emergency Department: Payer: Medicare Other

## 2014-09-30 ENCOUNTER — Encounter: Payer: Self-pay | Admitting: Emergency Medicine

## 2014-09-30 DIAGNOSIS — Z792 Long term (current) use of antibiotics: Secondary | ICD-10-CM | POA: Diagnosis not present

## 2014-09-30 DIAGNOSIS — Z7982 Long term (current) use of aspirin: Secondary | ICD-10-CM | POA: Diagnosis not present

## 2014-09-30 DIAGNOSIS — Z79899 Other long term (current) drug therapy: Secondary | ICD-10-CM | POA: Diagnosis not present

## 2014-09-30 DIAGNOSIS — R0602 Shortness of breath: Secondary | ICD-10-CM | POA: Diagnosis present

## 2014-09-30 DIAGNOSIS — J441 Chronic obstructive pulmonary disease with (acute) exacerbation: Secondary | ICD-10-CM | POA: Diagnosis not present

## 2014-09-30 DIAGNOSIS — I10 Essential (primary) hypertension: Secondary | ICD-10-CM | POA: Insufficient documentation

## 2014-09-30 HISTORY — DX: Chronic obstructive pulmonary disease, unspecified: J44.9

## 2014-09-30 HISTORY — DX: Essential (primary) hypertension: I10

## 2014-09-30 LAB — CBC
HCT: 41.3 % (ref 40.0–52.0)
HEMOGLOBIN: 13.8 g/dL (ref 13.0–18.0)
MCH: 29.5 pg (ref 26.0–34.0)
MCHC: 33.5 g/dL (ref 32.0–36.0)
MCV: 88.3 fL (ref 80.0–100.0)
PLATELETS: 187 10*3/uL (ref 150–440)
RBC: 4.68 MIL/uL (ref 4.40–5.90)
RDW: 14.1 % (ref 11.5–14.5)
WBC: 9.3 10*3/uL (ref 3.8–10.6)

## 2014-09-30 LAB — BASIC METABOLIC PANEL
ANION GAP: 9 (ref 5–15)
BUN: 22 mg/dL — AB (ref 6–20)
CO2: 28 mmol/L (ref 22–32)
Calcium: 9.1 mg/dL (ref 8.9–10.3)
Chloride: 97 mmol/L — ABNORMAL LOW (ref 101–111)
Creatinine, Ser: 1.57 mg/dL — ABNORMAL HIGH (ref 0.61–1.24)
GFR calc Af Amer: 46 mL/min — ABNORMAL LOW (ref >60)
GFR calc non Af Amer: 39 mL/min — ABNORMAL LOW (ref >60)
Glucose, Bld: 348 mg/dL — ABNORMAL HIGH (ref 65–99)
POTASSIUM: 4.1 mmol/L (ref 3.5–5.1)
SODIUM: 134 mmol/L — AB (ref 135–145)

## 2014-09-30 LAB — TROPONIN I: Troponin I: <0.03 ng/mL (ref <0.031)

## 2014-09-30 MED ORDER — IPRATROPIUM-ALBUTEROL 0.5-2.5 (3) MG/3ML IN SOLN
RESPIRATORY_TRACT | Status: AC
  Filled 2014-09-30: qty 6

## 2014-09-30 MED ORDER — IPRATROPIUM-ALBUTEROL 0.5-2.5 (3) MG/3ML IN SOLN
9.0000 mL | Freq: Once | RESPIRATORY_TRACT | Status: AC
  Administered 2014-09-30: 9 mL via RESPIRATORY_TRACT

## 2014-09-30 MED ORDER — IPRATROPIUM-ALBUTEROL 0.5-2.5 (3) MG/3ML IN SOLN
RESPIRATORY_TRACT | Status: AC
  Filled 2014-09-30: qty 3

## 2014-09-30 NOTE — ED Provider Notes (Addendum)
[facility] Emergency Department Provider Note  ____________________________________________  Time seen: Approximately 6:37 PM  I have reviewed the triage vital signs and the nursing notes.   HISTORY  Chief Complaint Shortness of Breath    HPI Gregary SignsWilliam E Massey is a 81 y.o. male with a history of COPD and hypertension who presents today with dyspnea that was worse yesterday. He says that he has had rhinorrhea and cough which was productive that has improved. He was on amoxicillin and then a Z-Pak. He has also been taking his inhaler at home. However, since his illness has been ongoing he decided to come into the emergency department today. He does not smoke any longer. He was given a prescription for prednisone at the urgent care last Monday which she did not take because he says that it makes his glucose to high. He denies any chest pain and diaphoresis, nausea vomiting or diarrhea.   Past Medical History  Diagnosis Date  . COPD (chronic obstructive pulmonary disease)   . Hypertension     There are no active problems to display for this patient.   Past Surgical History  Procedure Laterality Date  . Coronary artery bypass graft      Current Outpatient Rx  Name  Route  Sig  Dispense  Refill  . albuterol (PROVENTIL HFA;VENTOLIN HFA) 108 (90 BASE) MCG/ACT inhaler   Inhalation   Inhale 2 puffs into the lungs every 4 (four) hours as needed for wheezing or shortness of breath.   1 Inhaler   1   . aspirin EC 81 MG tablet   Oral   Take 81 mg by mouth daily.         Marland Kitchen. azithromycin (ZITHROMAX Z-PAK) 250 MG tablet      Take 2 tablets first day and then 1 po a day for 4 days   6 tablet   0   . benzonatate (TESSALON) 200 MG capsule   Oral   Take 200 mg by mouth 3 (three) times daily as needed for cough.         . carvedilol (COREG) 6.25 MG tablet   Oral   Take 6.25 mg by mouth 2 (two) times daily with a meal.         . fluticasone-salmeterol  (ADVAIR HFA) 115-21 MCG/ACT inhaler   Inhalation   Inhale 2 puffs into the lungs 2 (two) times daily.         Marland Kitchen. glimepiride (AMARYL) 4 MG tablet   Oral   Take 8 mg by mouth daily with breakfast.         . Multiple Vitamins-Minerals (PRESERVISION AREDS 2 PO)   Oral   Take by mouth.         Marland Kitchen. amoxicillin (AMOXIL) 875 MG tablet   Oral   Take 875 mg by mouth 2 (two) times daily.           Allergies Review of patient's allergies indicates no known allergies.  No family history on file.  Social History History  Substance Use Topics  . Smoking status: Never Smoker   . Smokeless tobacco: Not on file  . Alcohol Use: No    Review of Systems Constitutional: No fever/chills Eyes: No visual changes. ENT: No sore throat. Cardiovascular: Denies chest pain. Respiratory: As above  Gastrointestinal: No abdominal pain.  No nausea, no vomiting.  No diarrhea.  No constipation. Genitourinary: Negative for dysuria. Musculoskeletal: Negative for back pain. Skin: Negative for rash. Neurological: Negative for headaches,  focal weakness or numbness.  10-point ROS otherwise negative.  ____________________________________________   PHYSICAL EXAM:  VITAL SIGNS: ED Triage Vitals  Enc Vitals Group     BP 09/30/14 1414 179/118 mmHg     Pulse Rate 09/30/14 1414 83     Resp 09/30/14 1414 20     Temp 09/30/14 1414 98 F (36.7 C)     Temp Source 09/30/14 1414 Oral     SpO2 09/30/14 1414 97 %     Weight 09/30/14 1414 160 lb (72.576 kg)     Height 09/30/14 1414 6' (1.829 m)     Head Cir --      Peak Flow --      Pain Score 09/30/14 1414 10     Pain Loc --      Pain Edu? --      Excl. in GC? --     Constitutional: Alert and oriented. Well appearing and in no acute distress. Eyes: Conjunctivae are normal. PERRL. EOMI. Head: Atraumatic. Nose: No congestion/rhinnorhea. Mouth/Throat: Mucous membranes are moist.  Oropharynx non-erythematous. Neck: No stridor.   Cardiovascular:  Normal rate, regular rhythm. Grossly normal heart sounds.  Good peripheral circulation. Respiratory: Normal respiratory effort.  No retractions. Speaks in full sentences. Has a prolonged expiratory phase without cough on expiration.  Gastrointestinal: Soft and nontender. No distention. No abdominal bruits. No CVA tenderness. Musculoskeletal: No lower extremity tenderness nor edema.  No joint effusions. Neurologic:  Normal speech and language. No gross focal neurologic deficits are appreciated. Speech is normal. No gait instability. Skin:  Skin is warm, dry and intact. No rash noted. Psychiatric: Mood and affect are normal. Speech and behavior are normal.  ____________________________________________   LABS (all labs ordered are listed, but only abnormal results are displayed)  Labs Reviewed  BASIC METABOLIC PANEL - Abnormal; Notable for the following:    Sodium 134 (*)    Chloride 97 (*)    Glucose, Bld 348 (*)    BUN 22 (*)    Creatinine, Ser 1.57 (*)    GFR calc non Af Amer 39 (*)    GFR calc Af Amer 46 (*)    All other components within normal limits  CBC  TROPONIN I   ____________________________________________  EKG  ED ECG REPORT I, Arelia Longest, the attending physician, personally viewed and interpreted this ECG.   Date: 09/30/2014  EKG Time: 1426  Rate: 83  Rhythm: normal sinus rhythm  Axis: Left axis deviation  Intervals:none  ST&T Change: T wave inversions in aVL. No change from EKG from 03/12/2010.  ____________________________________________  RADIOLOGY  Severe COPD. No change from prior exam. ____________________________________________   PROCEDURES    ____________________________________________   INITIAL IMPRESSION / ASSESSMENT AND PLAN / ED COURSE  Pertinent labs & imaging results that were available during my care of the patient were reviewed by me and considered in my medical decision making (see chart for  details).  ----------------------------------------- 7:50 PM on 09/30/2014 -----------------------------------------  Patient says feels back to baseline after her 3 duo nebs. Lungs auscultated and clear bilaterally with improved air movement. Discharge to home. Reassuring EKG as well as blood work for cardiac disease. We'll not prescribe steroids secondary to patient's blood glucose already elevated and patient feels back to baseline. We'll discharge to home. Will follow up with primary care doctor. ____________________________________________   FINAL CLINICAL IMPRESSION(S) / ED DIAGNOSES  Acute COPD exacerbation. Return visit.    Myrna Blazer, MD 09/30/14 1951  Patient has  nebulizer machine at home and would use as needed.  Myrna Blazer, MD 09/30/14 [id number]

## 2014-09-30 NOTE — Discharge Instructions (Signed)
Chronic Obstructive Pulmonary Disease Exacerbation ° Chronic obstructive pulmonary disease (COPD) is a common lung problem. In COPD, the flow of air from the lungs is limited. COPD exacerbations are times that breathing gets worse and you need extra treatment. Without treatment they can be life threatening. If they happen often, your lungs can become more damaged. °HOME CARE °· Do not smoke. °· Avoid tobacco smoke and other things that bother your lungs. °· If given, take your antibiotic medicine as told. Finish the medicine even if you start to feel better. °· Only take medicines as told by your doctor. °· Drink enough fluids to keep your pee (urine) clear or pale yellow (unless your doctor has told you not to). °· Use a cool mist machine (vaporizer). °· If you use oxygen or a machine that turns liquid medicine into a mist (nebulizer), continue to use them as told. °· Keep up with shots (vaccinations) as told by your doctor. °· Exercise regularly. °· Eat healthy foods. °· Keep all doctor visits as told. °GET HELP RIGHT AWAY IF: °· You are very short of breath and it gets worse. °· You have trouble talking. °· You have bad chest pain. °· You have blood in your spit (sputum). °· You have a fever. °· You keep throwing up (vomiting). °· You feel weak, or you pass out (faint). °· You feel confused. °· You keep getting worse. °MAKE SURE YOU:  °· Understand these instructions. °· Will watch your condition. °· Will get help right away if you are not doing well or get worse. °Document Released: 04/02/2011 Document Revised: 02/01/2013 Document Reviewed: 12/16/2012 °[employer]® Patient Information ©2015 [employer]. This information is not intended to replace advice given to you by your health care provider. Make sure you discuss any questions you have with your health care provider. ° °

## 2014-09-30 NOTE — ED Notes (Signed)
Pt with shortness of breath. Was seen at urgent earlier in the week and given antibiotics and states is not any better.  No respiratory distress noted.

## 2015-03-22 ENCOUNTER — Encounter: Payer: Self-pay | Admitting: Emergency Medicine

## 2015-03-22 ENCOUNTER — Ambulatory Visit
Admission: EM | Admit: 2015-03-22 | Discharge: 2015-03-22 | Disposition: A | Discharge status: Home / Self Care | Payer: Medicare Other | Attending: Family Medicine | Admitting: Family Medicine

## 2015-03-22 DIAGNOSIS — J439 Emphysema, unspecified: Secondary | ICD-10-CM

## 2015-03-22 DIAGNOSIS — J441 Chronic obstructive pulmonary disease with (acute) exacerbation: Secondary | ICD-10-CM

## 2015-03-22 MED ORDER — PREDNISONE 10 MG (21) PO TBPK: ORAL_TABLET | ORAL | Status: DC

## 2015-03-22 MED ORDER — IPRATROPIUM-ALBUTEROL 0.5-2.5 (3) MG/3ML IN SOLN
3.0000 mL | Freq: Once | RESPIRATORY_TRACT | Status: AC
  Administered 2015-03-22: 3 mL via RESPIRATORY_TRACT

## 2015-03-22 MED ORDER — METHYLPREDNISOLONE SODIUM SUCC 125 MG IJ SOLR
125.0000 mg | Freq: Once | INTRAMUSCULAR | Status: AC
  Administered 2015-03-22: 125 mg via INTRAMUSCULAR

## 2015-03-22 NOTE — Discharge Instructions (Signed)
Chronic Obstructive Pulmonary Disease °Chronic obstructive pulmonary disease (COPD) is a common lung problem. In COPD, the flow of air from the lungs is limited. The way your lungs work will probably never return to normal, but there are things you can do to improve your lungs and make yourself feel better. Your doctor may treat your condition with: °· Medicines. °· Oxygen. °· Lung surgery. °· Changes to your diet. °· Rehabilitation. This may involve a team of specialists. °HOME CARE °· Take all medicines as told by your doctor. °· Avoid medicines or cough syrups that dry up your airway (such as antihistamines) and do not allow you to get rid of thick spit. You do not need to avoid them if told differently by your doctor. °· If you smoke, stop. Smoking makes the problem worse. °· Avoid being around things that make your breathing worse (like smoke, chemicals, and fumes). °· Use oxygen therapy and therapy to help improve your lungs (pulmonary rehabilitation) if told by your doctor. If you need home oxygen therapy, ask your doctor if you should buy a tool to measure your oxygen level (oximeter). °· Avoid people who have a sickness you can catch (contagious). °· Avoid going outside when it is very hot, cold, or humid. °· Eat healthy foods. Eat smaller meals more often. Rest before meals. °· Stay active, but remember to also rest. °· Make sure to get all the shots (vaccines) your doctor recommends. Ask your doctor if you need a pneumonia shot. °· Learn and use tips on how to relax. °· Learn and use tips on how to control your breathing as told by your doctor. Try: °¨ Breathing in (inhaling) through your nose for 1 second. Then, pucker your lips and breath out (exhale) through your lips for 2 seconds. °¨ Putting one hand on your belly (abdomen). Breathe in slowly through your nose for 1 second. Your hand on your belly should move out. Pucker your lips and breathe out slowly through your lips. Your hand on your belly  should move in as you breathe out. °· Learn and use controlled coughing to clear thick spit from your lungs. The steps are: °1. Lean your head a little forward. °2. Breathe in deeply. °3. Try to hold your breath for 3 seconds. °4. Keep your mouth slightly open while coughing 2 times. °5. Spit any thick spit out into a tissue. °6. Rest and do the steps again 1 or 2 times as needed. °GET HELP IF: °· You cough up more thick spit than usual. °· There is a change in the color or thickness of the spit. °· It is harder to breathe than usual. °· Your breathing is faster than usual. °GET HELP RIGHT AWAY IF: °· You have shortness of breath while resting. °· You have shortness of breath that stops you from: °¨ Being able to talk. °¨ Doing normal activities. °· You chest hurts for longer than 5 minutes. °· Your skin color is more blue than usual. °· Your pulse oximeter shows that you have low oxygen for longer than 5 minutes. °MAKE SURE YOU: °· Understand these instructions. °· Will watch your condition. °· Will get help right away if you are not doing well or get worse. °  °This information is not intended to replace advice given to you by your health care provider. Make sure you discuss any questions you have with your health care provider. °  °Document Released: 09/30/2007 Document Revised: 05/04/2014 Document Reviewed: 12/08/2012 °Elsevier Interactive Patient   Education ©2016 [employer] ° °

## 2015-03-22 NOTE — ED Provider Notes (Signed)
CSN: [id number]     Arrival date & time 03/22/15  0908 History   None   Nurses notes were reviewed. Chief Complaint  Patient presents with  . Shortness of Breath    Patient reports short of breath for several days progressively getting worse. States this morning pulse ox was about 85% states that come in to be seen and evaluated. States he has emphysema from past stopped smoking about 10-15 years ago. States Dr. Meredeth IdeFleming has had prednisone before. (Consider location/radiation/quality/duration/timing/severity/associated sxs/prior Treatment) Patient is a 81 y.o. male presenting with shortness of breath. The history is provided by the patient. No language interpreter was used.  Shortness of Breath Severity:  Severe Onset quality:  Gradual Duration: Several days but unable to specify how many days he has been short of breath or coughing. Timing:  Constant Progression:  Worsening Chronicity:  Recurrent Context: activity and URI   Context: not animal exposure, not emotional upset, not fumes, not known allergens, not occupational exposure, not pollens, not smoke exposure, not strong odors and not weather changes   Relieved by:  Nothing Ineffective treatments:  Inhaler Associated symptoms: cough and wheezing   Associated symptoms: no chest pain, no fever, no headaches, no hemoptysis, no rash, no sore throat, no sputum production, no syncope and no vomiting   Risk factors: no recent alcohol use, no family hx of DVT, no hx of cancer, no hx of PE/DVT, no obesity, no prolonged immobilization, no recent surgery and no tobacco use     Past Medical History  Diagnosis Date  . COPD (chronic obstructive pulmonary disease) (HCC)   . Hypertension    Past Surgical History  Procedure Laterality Date  . Coronary artery bypass graft     No family history on file. Social History  Substance Use Topics  . Smoking status: Never Smoker   . Smokeless tobacco: None  . Alcohol Use: No    Review of  Systems  Constitutional: Negative for fever.  HENT: Negative for sore throat.   Respiratory: Positive for cough, shortness of breath and wheezing. Negative for hemoptysis and sputum production.   Cardiovascular: Negative for chest pain and syncope.  Gastrointestinal: Negative for vomiting.  Skin: Negative for rash.  Neurological: Negative for headaches.    Allergies  Review of patient's allergies indicates no known allergies.  Home Medications   Prior to Admission medications   Medication Sig Start Date End Date Taking? Authorizing Provider  albuterol (PROVENTIL HFA;VENTOLIN HFA) 108 (90 BASE) MCG/ACT inhaler Inhale 2 puffs into the lungs every 4 (four) hours as needed for wheezing or shortness of breath. 09/24/14  Yes Hassan RowanEugene Dimas Scheck, MD  aspirin EC 81 MG tablet Take 81 mg by mouth daily.   Yes Historical Provider, MD  carvedilol (COREG) 6.25 MG tablet Take 6.25 mg by mouth 2 (two) times daily with a meal.   Yes Historical Provider, MD  fluticasone-salmeterol (ADVAIR HFA) 115-21 MCG/ACT inhaler Inhale 2 puffs into the lungs 2 (two) times daily.   Yes Historical Provider, MD  glimepiride (AMARYL) 4 MG tablet Take 8 mg by mouth daily with breakfast.   Yes Historical Provider, MD  Multiple Vitamins-Minerals (PRESERVISION AREDS 2 PO) Take by mouth.   Yes Historical Provider, MD  amoxicillin (AMOXIL) 875 MG tablet Take 875 mg by mouth 2 (two) times daily.    Historical Provider, MD  azithromycin (ZITHROMAX Z-PAK) 250 MG tablet Take 2 tablets first day and then 1 po a day for 4 days 09/24/14   Hassan RowanEugene Markela Wee,  MD  benzonatate (TESSALON) 200 MG capsule Take 200 mg by mouth 3 (three) times daily as needed for cough.    Historical Provider, MD  predniSONE (STERAPRED UNI-PAK 21 TAB) 10 MG (21) TBPK tablet Sig 6 tablet day 1, 5 tablets day 2, 4 tablets day 3,,3tablets day 4, 2 tablets day 5, 1 tablet day 6 take all tablets orally 03/22/15   Hassan Rowan, MD   Meds Ordered and Administered this Visit    Medications  ipratropium-albuterol (DUONEB) 0.5-2.5 (3) MG/3ML nebulizer solution 3 mL (3 mLs Nebulization Given 03/22/15 0941)  methylPREDNISolone sodium succinate (SOLU-MEDROL) 125 mg/2 mL injection 125 mg (125 mg Intramuscular Given 03/22/15 1039)    BP 146/67 mmHg  Pulse 77  Temp(Src) 97.2 F (36.2 C) (Tympanic)  Resp 16  Ht  (1.854 m)  Wt 156 lb (70.761 kg)  BMI 20.59 kg/m2  SpO2 94% No data found.   Physical Exam  Constitutional: He is oriented to person, place, and time. He appears cachectic. He has a sickly appearance. He appears distressed. He is not intubated.  Elderly white male.  HENT:  Head: Normocephalic and atraumatic.  Left Ear: External ear normal.  Eyes: Conjunctivae are normal. Pupils are equal, round, and reactive to light.  Neck: Normal range of motion. Neck supple.  Cardiovascular: Normal rate and normal heart sounds.  Exam reveals no distant heart sounds.   Pulmonary/Chest: No accessory muscle usage. He is not intubated. No respiratory distress.  Musculoskeletal: Normal range of motion.  Lymphadenopathy:    He has no cervical adenopathy.  Neurological: He is alert and oriented to person, place, and time. No cranial nerve deficit.  Skin: Skin is warm and dry.  Psychiatric: He has a normal mood and affect.  Vitals reviewed.   ED Course  Procedures (including critical care time)  Labs Review Labs Reviewed - No data to display  Imaging Review No results found.   Visual Acuity Review  Right Eye Distance:   Left Eye Distance:   Bilateral Distance:    Right Eye Near:   Left Eye Near:    Bilateral Near:         MDM   1. Pulmonary emphysema, unspecified emphysema type (HCC)   2. COPD with acute exacerbation [facility])       Patient's wife were informed that I thought that they need to go to the emergency room now recommend going by EMS. Pulse ox is only 79 and blood pressure is markedly hypertensive as well with blood pressure being  75 of 44. There is reluctant to go to emergency room many hours so will go initiated treatment here and reevaluate. Patient was given a DuoNeb treatment and 3 L of oxygen was started on him as well. Patient responded after DuoNeb and pulse ox is 95 kg they rechecked pulse ox 96 systolic blood pressures going up into the 130 range as well. Patient states he feels much better. This time we'll try to wean patient off his O2 3 L if we to wean him off his O2 needs me to maintain O2 sats will put him on a six-day taper prednisone package. One if his breathing gets worse he needs to go to the emergency room. Into using his inhalers he did inform me that he used to be on oxygen at night but because breathing improved they finally were able to wean him off his O2 at night. He does feel much better now.  Also to keep his O2 sat 94%  we'll discharge him home on a six-day course of prednisone and follow-up Dr. Meredeth Ide.    Hassan Rowan, MD 03/22/15 1051

## 2015-03-22 NOTE — ED Notes (Signed)
Shortness of breath for 3 days 

## 2015-04-13 ENCOUNTER — Inpatient Hospital Stay
Admission: EM | Admit: 2015-04-13 | Discharge: 2015-04-16 | DRG: 292 | Disposition: A | Discharge status: Home / Self Care | Payer: Medicare Other | Attending: Internal Medicine | Admitting: Internal Medicine

## 2015-04-13 ENCOUNTER — Emergency Department: Payer: Medicare Other

## 2015-04-13 ENCOUNTER — Encounter: Payer: Self-pay | Admitting: Emergency Medicine

## 2015-04-13 DIAGNOSIS — Z8679 Personal history of other diseases of the circulatory system: Secondary | ICD-10-CM

## 2015-04-13 DIAGNOSIS — I5023 Acute on chronic systolic (congestive) heart failure: Principal | ICD-10-CM | POA: Diagnosis present

## 2015-04-13 DIAGNOSIS — Z7982 Long term (current) use of aspirin: Secondary | ICD-10-CM

## 2015-04-13 DIAGNOSIS — N183 Chronic kidney disease, stage 3 (moderate): Secondary | ICD-10-CM | POA: Diagnosis present

## 2015-04-13 DIAGNOSIS — L03116 Cellulitis of left lower limb: Secondary | ICD-10-CM | POA: Diagnosis present

## 2015-04-13 DIAGNOSIS — J449 Chronic obstructive pulmonary disease, unspecified: Secondary | ICD-10-CM | POA: Diagnosis present

## 2015-04-13 DIAGNOSIS — E1151 Type 2 diabetes mellitus with diabetic peripheral angiopathy without gangrene: Secondary | ICD-10-CM | POA: Diagnosis present

## 2015-04-13 DIAGNOSIS — E1140 Type 2 diabetes mellitus with diabetic neuropathy, unspecified: Secondary | ICD-10-CM | POA: Diagnosis present

## 2015-04-13 DIAGNOSIS — L03115 Cellulitis of right lower limb: Secondary | ICD-10-CM | POA: Diagnosis present

## 2015-04-13 DIAGNOSIS — E785 Hyperlipidemia, unspecified: Secondary | ICD-10-CM | POA: Diagnosis present

## 2015-04-13 DIAGNOSIS — J90 Pleural effusion, not elsewhere classified: Secondary | ICD-10-CM | POA: Diagnosis not present

## 2015-04-13 DIAGNOSIS — I129 Hypertensive chronic kidney disease with stage 1 through stage 4 chronic kidney disease, or unspecified chronic kidney disease: Secondary | ICD-10-CM | POA: Diagnosis present

## 2015-04-13 DIAGNOSIS — R29898 Other symptoms and signs involving the musculoskeletal system: Secondary | ICD-10-CM

## 2015-04-13 DIAGNOSIS — I509 Heart failure, unspecified: Secondary | ICD-10-CM

## 2015-04-13 DIAGNOSIS — Z8249 Family history of ischemic heart disease and other diseases of the circulatory system: Secondary | ICD-10-CM

## 2015-04-13 DIAGNOSIS — I2510 Atherosclerotic heart disease of native coronary artery without angina pectoris: Secondary | ICD-10-CM | POA: Diagnosis present

## 2015-04-13 DIAGNOSIS — Z9049 Acquired absence of other specified parts of digestive tract: Secondary | ICD-10-CM

## 2015-04-13 DIAGNOSIS — E1122 Type 2 diabetes mellitus with diabetic chronic kidney disease: Secondary | ICD-10-CM | POA: Diagnosis present

## 2015-04-13 DIAGNOSIS — Z951 Presence of aortocoronary bypass graft: Secondary | ICD-10-CM | POA: Diagnosis not present

## 2015-04-13 HISTORY — DX: Chronic kidney disease, unspecified: N18.9

## 2015-04-13 HISTORY — DX: Type 2 diabetes mellitus without complications: E11.9

## 2015-04-13 HISTORY — DX: Atherosclerotic heart disease of native coronary artery without angina pectoris: I25.10

## 2015-04-13 HISTORY — DX: Type 2 diabetes mellitus with diabetic neuropathy, unspecified: E11.40

## 2015-04-13 LAB — TROPONIN I
TROPONIN I: 0.05 ng/mL — AB (ref <0.031)
Troponin I: 0.05 ng/mL — ABNORMAL HIGH (ref <0.031)

## 2015-04-13 LAB — CBC
HCT: 40.1 % (ref 40.0–52.0)
Hemoglobin: 13.5 g/dL (ref 13.0–18.0)
MCH: 28.9 pg (ref 26.0–34.0)
MCHC: 33.6 g/dL (ref 32.0–36.0)
MCV: 85.9 fL (ref 80.0–100.0)
Platelets: 183 10*3/uL (ref 150–440)
RBC: 4.66 MIL/uL (ref 4.40–5.90)
RDW: 15.6 % — AB (ref 11.5–14.5)
WBC: 8.4 10*3/uL (ref 3.8–10.6)

## 2015-04-13 LAB — TSH: TSH: 3.148 u[IU]/mL (ref 0.350–4.500)

## 2015-04-13 LAB — BASIC METABOLIC PANEL
Anion gap: 11 (ref 5–15)
BUN: 20 mg/dL (ref 6–20)
CALCIUM: 8.5 mg/dL — AB (ref 8.9–10.3)
CO2: 28 mmol/L (ref 22–32)
CREATININE: 1.65 mg/dL — AB (ref 0.61–1.24)
Chloride: 95 mmol/L — ABNORMAL LOW (ref 101–111)
GFR calc Af Amer: 43 mL/min — ABNORMAL LOW (ref >60)
GFR, EST NON AFRICAN AMERICAN: 37 mL/min — AB (ref >60)
Glucose, Bld: 82 mg/dL (ref 65–99)
Potassium: 3.2 mmol/L — ABNORMAL LOW (ref 3.5–5.1)
SODIUM: 134 mmol/L — AB (ref 135–145)

## 2015-04-13 LAB — GLUCOSE, CAPILLARY: GLUCOSE-CAPILLARY: 84 mg/dL (ref 65–99)

## 2015-04-13 LAB — BRAIN NATRIURETIC PEPTIDE: B Natriuretic Peptide: 1460 pg/mL — ABNORMAL HIGH (ref 0.0–100.0)

## 2015-04-13 MED ORDER — SODIUM CHLORIDE 0.9 % IV SOLN: 250.0000 mL | INTRAVENOUS | Status: DC | PRN

## 2015-04-13 MED ORDER — VITAMIN E 180 MG (400 UNIT) PO CAPS
1000.0000 [IU] | ORAL_CAPSULE | Freq: Every day | ORAL | Status: DC
  Administered 2015-04-14 – 2015-04-16 (×3): 1000 [IU] via ORAL
  Filled 2015-04-13 (×3): qty 2

## 2015-04-13 MED ORDER — ACETAMINOPHEN 325 MG PO TABS
650.0000 mg | ORAL_TABLET | Freq: Four times a day (QID) | ORAL | Status: DC | PRN
Start: 2015-04-13 — End: 2015-04-16

## 2015-04-13 MED ORDER — ASPIRIN EC 81 MG PO TBEC: 81.0000 mg | DELAYED_RELEASE_TABLET | Freq: Every day | ORAL | Status: DC

## 2015-04-13 MED ORDER — GLIMEPIRIDE 4 MG PO TABS
8.0000 mg | ORAL_TABLET | Freq: Every day | ORAL | Status: DC
  Administered 2015-04-14: 8 mg via ORAL
  Filled 2015-04-13 (×2): qty 2

## 2015-04-13 MED ORDER — SODIUM CHLORIDE 0.9 % IJ SOLN
3.0000 mL | Freq: Two times a day (BID) | INTRAMUSCULAR | Status: DC
  Administered 2015-04-14 – 2015-04-15 (×2): 3 mL via INTRAVENOUS

## 2015-04-13 MED ORDER — ONDANSETRON HCL 4 MG PO TABS: 4.0000 mg | ORAL_TABLET | Freq: Four times a day (QID) | ORAL | Status: DC | PRN

## 2015-04-13 MED ORDER — ASPIRIN 81 MG PO CHEW
324.0000 mg | CHEWABLE_TABLET | Freq: Once | ORAL | Status: AC
  Administered 2015-04-13: 324 mg via ORAL

## 2015-04-13 MED ORDER — POTASSIUM CHLORIDE CRYS ER 20 MEQ PO TBCR
40.0000 meq | EXTENDED_RELEASE_TABLET | Freq: Once | ORAL | Status: DC
  Filled 2015-04-13: qty 2

## 2015-04-13 MED ORDER — ENOXAPARIN SODIUM 40 MG/0.4ML ~~LOC~~ SOLN
40.0000 mg | SUBCUTANEOUS | Status: DC
  Administered 2015-04-13 – 2015-04-15 (×3): 40 mg via SUBCUTANEOUS
  Filled 2015-04-13 (×3): qty 0.4

## 2015-04-13 MED ORDER — CARVEDILOL 6.25 MG PO TABS
6.2500 mg | ORAL_TABLET | Freq: Two times a day (BID) | ORAL | Status: DC
  Administered 2015-04-14 – 2015-04-16 (×5): 6.25 mg via ORAL
  Filled 2015-04-13 (×5): qty 1

## 2015-04-13 MED ORDER — INSULIN ASPART 100 UNIT/ML ~~LOC~~ SOLN
0.0000 [IU] | Freq: Three times a day (TID) | SUBCUTANEOUS | Status: DC
  Administered 2015-04-14: 2 [IU] via SUBCUTANEOUS
  Administered 2015-04-14: 3 [IU] via SUBCUTANEOUS
  Administered 2015-04-15: 7 [IU] via SUBCUTANEOUS
  Administered 2015-04-15 – 2015-04-16 (×2): 2 [IU] via SUBCUTANEOUS
  Administered 2015-04-16: 5 [IU] via SUBCUTANEOUS
  Filled 2015-04-13: qty 7
  Filled 2015-04-13: qty 3
  Filled 2015-04-13 (×2): qty 2
  Filled 2015-04-13: qty 5
  Filled 2015-04-13: qty 2

## 2015-04-13 MED ORDER — ALBUTEROL SULFATE (2.5 MG/3ML) 0.083% IN NEBU: 2.5000 mg | INHALATION_SOLUTION | RESPIRATORY_TRACT | Status: DC | PRN

## 2015-04-13 MED ORDER — VITAMIN D 1000 UNITS PO TABS
2000.0000 [IU] | ORAL_TABLET | Freq: Every day | ORAL | Status: DC
  Administered 2015-04-14 – 2015-04-16 (×3): 2000 [IU] via ORAL
  Filled 2015-04-13 (×5): qty 2

## 2015-04-13 MED ORDER — POTASSIUM CHLORIDE CRYS ER 20 MEQ PO TBCR
40.0000 meq | EXTENDED_RELEASE_TABLET | Freq: Once | ORAL | Status: AC
  Administered 2015-04-13: 40 meq via ORAL

## 2015-04-13 MED ORDER — INSULIN ASPART PROT & ASPART (70-30 MIX) 100 UNIT/ML ~~LOC~~ SUSP
10.0000 [IU] | Freq: Two times a day (BID) | SUBCUTANEOUS | Status: DC
  Administered 2015-04-14 – 2015-04-16 (×5): 10 [IU] via SUBCUTANEOUS
  Filled 2015-04-13 (×5): qty 10

## 2015-04-13 MED ORDER — ASPIRIN EC 81 MG PO TBEC
81.0000 mg | DELAYED_RELEASE_TABLET | Freq: Every day | ORAL | Status: DC
  Administered 2015-04-14 – 2015-04-16 (×3): 81 mg via ORAL
  Filled 2015-04-13 (×3): qty 1

## 2015-04-13 MED ORDER — FUROSEMIDE 10 MG/ML IJ SOLN
40.0000 mg | Freq: Once | INTRAMUSCULAR | Status: AC
  Administered 2015-04-13: 40 mg via INTRAVENOUS

## 2015-04-13 MED ORDER — ACETAMINOPHEN 650 MG RE SUPP: 650.0000 mg | Freq: Four times a day (QID) | RECTAL | Status: DC | PRN

## 2015-04-13 MED ORDER — FUROSEMIDE 10 MG/ML IJ SOLN
40.0000 mg | Freq: Two times a day (BID) | INTRAMUSCULAR | Status: DC
  Administered 2015-04-14 – 2015-04-16 (×5): 40 mg via INTRAVENOUS
  Filled 2015-04-13 (×6): qty 4

## 2015-04-13 MED ORDER — ALBUTEROL SULFATE HFA 108 (90 BASE) MCG/ACT IN AERS: 2.0000 | INHALATION_SPRAY | RESPIRATORY_TRACT | Status: DC | PRN

## 2015-04-13 MED ORDER — ONDANSETRON HCL 4 MG/2ML IJ SOLN: 4.0000 mg | Freq: Four times a day (QID) | INTRAMUSCULAR | Status: DC | PRN

## 2015-04-13 MED ORDER — MOMETASONE FURO-FORMOTEROL FUM 100-5 MCG/ACT IN AERO
2.0000 | INHALATION_SPRAY | Freq: Two times a day (BID) | RESPIRATORY_TRACT | Status: DC
  Administered 2015-04-13 – 2015-04-16 (×6): 2 via RESPIRATORY_TRACT
  Filled 2015-04-13: qty 8.8

## 2015-04-13 MED ORDER — SODIUM CHLORIDE 0.9 % IJ SOLN
3.0000 mL | Freq: Two times a day (BID) | INTRAMUSCULAR | Status: DC
  Administered 2015-04-13 – 2015-04-15 (×4): 3 mL via INTRAVENOUS

## 2015-04-13 MED ORDER — SODIUM CHLORIDE 0.9 % IJ SOLN: 3.0000 mL | INTRAMUSCULAR | Status: DC | PRN

## 2015-04-13 NOTE — Progress Notes (Signed)
Gregary SignsWilliam E Massey is a 81 y.o. male patient admitted from ED awake, alert - oriented  X 4 - no acute distress noted.  VSS - Blood pressure 139/54, pulse 71, temperature 97.8 F (36.6 C), temperature source Oral, resp. rate 17, height 6' (1.829 m), weight 76.204 kg (168 lb), SpO2 96 %.    IV in place, occlusive dsg intact without redness.  Orientation to room, and floor completed with information packet given to patient/family.  Admission INP armband ID verified with patient/family, and in place.   SR up x 2, fall assessment complete, with patient and family able to verbalize understanding of risk associated with falls, and verbalized understanding to call nsg before up out of bed.  Call light within reach, patient able to voice, and demonstrate understanding.  Skin, clean-dry- intact without evidence of bruising, or skin tears.   No evidence of skin break down noted on exam. Patient has some redness on bilateral buttocks, bilateral lower extremity edema with some redness noted. Skin assessed with Allayne ButcherSusan P.      Will cont to eval and treat per MD orders.  Rudean HaskellDonnisha R Kiven Vangilder, RN 04/13/2015 6:45 PM

## 2015-04-13 NOTE — ED Notes (Signed)
Patient was notified by his PCP/Card to come to ED 2 weeks ago for IV Lasix to assist in diuresis. Patient refused and was instructed to report to the ED if ShOB presented. Patient arrives now with ShOB, bilateral advantageous breath sounds, distended abdomen and bilateral 2+ pedal edema.

## 2015-04-13 NOTE — ED Notes (Signed)
Pt to ed with c/o sob and swelling in bilat feet and ankles and legs.  Pt states was supposed to be admitted to hospital on Thursday but refused.

## 2015-04-13 NOTE — H&P (Addendum)
[facility] [place] [facility]   PATIENT NAME: Kenneth Massey    MR#:  [id number]  DATE OF BIRTH:  09/06/1929  DATE OF ADMISSION:  04/13/2015  PRIMARY CARE PHYSICIAN: Jose PersiaHONDROS, DIMITRIOS P, MD   REQUESTING/REFERRING PHYSICIAN:   CHIEF COMPLAINT:   Chief Complaint  Patient presents with  . Leg Swelling  . Shortness of Breath    HISTORY OF PRESENT ILLNESS: Kenneth Massey  is a 81 y.o. male with a known history of  COPD, hypertension, chronic kidney disease, coronary artery disease, diabetes who has had shortness of breath for the past 2 weeks. He has been treated for COPD exasperation with prednisone. However his breathing troubles continued. He states that his shortness of breath got worst over the past few days. He's also noticed swelling in the past 2 weeks of his lower extremity. He has some weeping from his legs. He is unable to lay flat for the past 2 weeks. Has to sleep propped up. He also has gained 16 pounds. He has not had any chest pain. No coughing no wheezing. Patient came to the emergency room with the symptoms chest x-ray suggestive of CHF. Also BMP is elevated.      PAST MEDICAL HISTORY:   Past Medical History  Diagnosis Date  . COPD (chronic obstructive pulmonary disease) (HCC)   . Hypertension   . DM type 2 (diabetes mellitus, type 2) (HCC)   . CKD (chronic kidney disease)   . CAD (coronary artery disease)   . Diabetes mellitus with neuropathy (HCC)     PAST SURGICAL HISTORY:  Past Surgical History  Procedure Laterality Date  . Coronary artery bypass graft    . Cholecystectomy    . Abdominal aortic aneurysm repair    . Renal artery stent      SOCIAL HISTORY:  Social History  Substance Use Topics  . Smoking status: Never Smoker   . Smokeless tobacco: Not on file  . Alcohol Use: No    FAMILY HISTORY:  Family History  Problem Relation Age of Onset  . Hypertension      DRUG ALLERGIES: No Known Allergies  REVIEW  OF SYSTEMS:   CONSTITUTIONAL: No fever, fatigue or weakness.  EYES: No blurred or double vision.  EARS, NOSE, AND THROAT: No tinnitus or ear pain.  RESPIRATORY: No cough, positive shortness of breath, no wheezing or hemoptysis.  CARDIOVASCULAR: No chest pain, orthopnea, edema.  GASTROINTESTINAL: No nausea, vomiting, diarrhea or abdominal pain.  GENITOURINARY: No dysuria, hematuria.  ENDOCRINE: No polyuria, nocturia,  HEMATOLOGY: No anemia, easy bruising or bleeding SKIN: Has red lesions on his lower extremity MUSCULOSKELETAL: No joint pain or arthritis.   NEUROLOGIC: No tingling, numbness, weakness.  PSYCHIATRY: No anxiety or depression.   MEDICATIONS AT HOME:      Medication List    ASK your doctor about these medications        albuterol 108 (90 BASE) MCG/ACT inhaler  Commonly known as:  PROVENTIL HFA;VENTOLIN HFA  Inhale 2 puffs into the lungs every 4 (four) hours as needed for wheezing or shortness of breath.     aspirin EC 81 MG tablet  Take 81 mg by mouth daily.     azithromycin 250 MG tablet  Commonly known as:  ZITHROMAX Z-PAK  Take 2 tablets first day and then 1 po a day for 4 days     carvedilol 6.25 MG tablet  Commonly known as:  COREG  Take 6.25 mg by mouth 2 (two)  times daily with a meal.     Fluticasone-Salmeterol 250-50 MCG/DOSE Aepb  Commonly known as:  ADVAIR  Inhale 1 puff into the lungs 2 (two) times daily.     furosemide 20 MG tablet  Commonly known as:  LASIX  Take 20 mg by mouth daily as needed. For fluid.     glimepiride 4 MG tablet  Commonly known as:  AMARYL  Take 8 mg by mouth daily with breakfast.     HUMULIN 70/30 (70-30) 100 UNIT/ML injection  Generic drug:  insulin NPH-regular Human  Inject 20 Units into the skin 2 (two) times daily before a meal.     predniSONE 10 MG (21) Tbpk tablet  Commonly known as:  STERAPRED UNI-PAK 21 TAB  Sig 6 tablet day 1, 5 tablets day 2, 4 tablets day 3,,3tablets day 4, 2 tablets day 5, 1 tablet day 6  take all tablets orally     PRESERVISION AREDS 2 PO  Take 1 capsule by mouth 2 (two) times daily.     Vitamin D3 2000 UNITS capsule  Take 2,000 Units by mouth daily.     vitamin E 1000 UNIT capsule  Generic drug:  vitamin E  Take 1,000 Units by mouth daily.       PHYSICAL EXAMINATION:   VITAL SIGNS: Blood pressure 132/65, pulse 71, temperature 97.3 F (36.3 C), temperature source Oral, resp. rate 15, height 6' (1.829 m), weight 76.204 kg (168 lb), SpO2 96 %.  GENERAL:  81 y.o.-year-old patient lying in the bed with no acute distress.  EYES: Pupils equal, round, reactive to light and accommodation. No scleral icterus. Extraocular muscles intact.  HEENT: Head atraumatic, normocephalic. Oropharynx and nasopharynx clear.  NECK:  Supple, no jugular venous distention. No thyroid enlargement, no tenderness.  LUNGS: Bilateral crackles at the bases no sensory muscle usage no wheezing CARDIOVASCULAR: S1, S2 normal. No murmurs, rubs, or gallops.  ABDOMEN: Soft, nontender, nondistended. Bowel sounds present. No organomegaly or mass.  EXTREMITIES: 2+ pedal edema , with erythematous lesions with some warmth felt.  NEUROLOGIC: Cranial nerves II through XII are intact. Muscle strength 5/5 in all extremities. Sensation intact. Gait not checked.  PSYCHIATRIC: The patient is alert and oriented x 3.  SKIN: No obvious rash, lesion, or ulcer.   LABORATORY PANEL:   CBC  Recent Labs Lab 04/13/15 1337  WBC 8.4  HGB 13.5  HCT 40.1  PLT 183  MCV 85.9  MCH 28.9  MCHC 33.6  RDW 15.6*   ------------------------------------------------------------------------------------------------------------------  Chemistries   Recent Labs Lab 04/13/15 1337  NA 134*  K 3.2*  CL 95*  CO2 28  GLUCOSE 82  BUN 20  CREATININE 1.65*  CALCIUM 8.5*   ------------------------------------------------------------------------------------------------------------------ estimated creatinine clearance is 36.6  mL/min (by C-G formula based on Cr of 1.65). ------------------------------------------------------------------------------------------------------------------ No results for input(s): TSH, T4TOTAL, T3FREE, THYROIDAB in the last 72 hours.  Invalid input(s): FREET3   Coagulation profile No results for input(s): INR, PROTIME in the last 168 hours. ------------------------------------------------------------------------------------------------------------------- No results for input(s): DDIMER in the last 72 hours. -------------------------------------------------------------------------------------------------------------------  Cardiac Enzymes  Recent Labs Lab 04/13/15 1337  TROPONINI 0.05*   ------------------------------------------------------------------------------------------------------------------ Invalid input(s): POCBNP  ---------------------------------------------------------------------------------------------------------------  Urinalysis    Component Value Date/Time   COLORURINE YELLOW 10/17/2008 1400   APPEARANCEUR CLOUDY* 10/17/2008 1400   LABSPEC 1.028 10/17/2008 1400   PHURINE 5.5 10/17/2008 1400   GLUCOSEU >1000* 10/17/2008 1400   HGBUR TRACE* 10/17/2008 1400   BILIRUBINUR NEGATIVE 10/17/2008 1400   KETONESUR NEGATIVE  10/17/2008 1400   PROTEINUR NEGATIVE 10/17/2008 1400   UROBILINOGEN 1.0 10/17/2008 1400   NITRITE NEGATIVE 10/17/2008 1400   LEUKOCYTESUR NEGATIVE 10/17/2008 1400     RADIOLOGY: Dg Chest 2 View  04/13/2015  CLINICAL DATA:  Shortness of Breath EXAM: CHEST - 2 VIEW COMPARISON:  09/30/2014 FINDINGS: Cardiac shadow is stable. Postsurgical changes are again noted. Bilateral pleural effusions left greater than right are seen. Mild interstitial changes are noted which may be related to edema. No focal infiltrate is seen. No acute bony abnormality is noted. IMPRESSION: New bilateral pleural effusions left greater than right, changes of mild  interstitial edema Electronically Signed   By: Alcide Clever M.D.   On: 04/13/2015 14:32    EKG: Orders placed or performed during the hospital encounter of 04/13/15  . ED EKG within 10 minutes  . ED EKG within 10 minutes  . EKG 12-Lead  . EKG 12-Lead    IMPRESSION AND PLAN: Patient is a 81 year old white male with history of coronary artery disease, COPD, diabetes type 2, hypertension presents with shortness of breath  1. Acute CHF type unknown: At this time will treat him with IV Lasix. Obtain cardiology consult. Echocardiogram of the heart. Monitor renal function with an acute kidney disease.  2. Diabetes type 2: Recently started on insulin I'll place him on sliding scale insulin as well as resume home regimen.  3. COPD without any evidence of acute exasperation continue Advair and duo nebs as doing at home  4. Hypertension continue Coreg.  5. Chronic kidney disease monitor renal function  6. Miscellaneous patient will be on heparin for DVT prophylaxis    All the records are reviewed and case discussed with ED provider. Management plans discussed with the patient, family and they are in agreement.  CODE STATUS: Full    TOTAL TIME TAKING CARE OF THIS PATIENT: 55 minutes.    Auburn Bilberry M.D on 04/13/2015 at 3:58 PM  Between 7am to 6pm - Pager - [phone]  After 6pm go to [email or web address] - password [email or web address] [facility]  [facility]  Office  [phone]  CC: Primary care physician; Jose Persia, MD

## 2015-04-13 NOTE — ED Provider Notes (Signed)
[facility] Emergency Department Provider Note  Time seen: 2:20 PM  I have reviewed the triage vital signs and the nursing notes.   HISTORY  Chief Complaint Leg Swelling and Shortness of Breath    HPI Kenneth Massey is a 81 y.o. male with a past medical history of hypertension, hyperlipidemia, diabetes, peripheral edema who presents the emergency department with 2 weeks of worsening shortness of breath and lower extremity edema. Patient has been followed by his primary care doctor [name] as well as [name], they have been adjusting his Lasix, increased it to 40 mg daily from 20 mg daily without improvement. Patient states his shortness of breath has worsened significantly, is unable to get up or walk without becoming short of breath. Spoke with [name] who recommended they come to the emergency department for admission to the hospital and IV Lasix. Denies chest pain.Describes shortness of breath is mild while at rest, moderate diffuse light is flat, moderate to significant if he attempts to exert himself in the least.     Past Medical History  Diagnosis Date  . COPD (chronic obstructive pulmonary disease) (HCC)   . Hypertension     There are no active problems to display for this patient.   Past Surgical History  Procedure Laterality Date  . Coronary artery bypass graft      Current Outpatient Rx  Name  Route  Sig  Dispense  Refill  . albuterol (PROVENTIL HFA;VENTOLIN HFA) 108 (90 BASE) MCG/ACT inhaler   Inhalation   Inhale 2 puffs into the lungs every 4 (four) hours as needed for wheezing or shortness of breath.   1 Inhaler   1   . amoxicillin (AMOXIL) 875 MG tablet   Oral   Take 875 mg by mouth 2 (two) times daily.         Marland Kitchen aspirin EC 81 MG tablet   Oral   Take 81 mg by mouth daily.         Marland Kitchen azithromycin (ZITHROMAX Z-PAK) 250 MG tablet      Take 2 tablets first day and then 1 po a day for 4 days   6 tablet   0   .  benzonatate (TESSALON) 200 MG capsule   Oral   Take 200 mg by mouth 3 (three) times daily as needed for cough.         . carvedilol (COREG) 6.25 MG tablet   Oral   Take 6.25 mg by mouth 2 (two) times daily with a meal.         . fluticasone-salmeterol (ADVAIR HFA) 115-21 MCG/ACT inhaler   Inhalation   Inhale 2 puffs into the lungs 2 (two) times daily.         Marland Kitchen glimepiride (AMARYL) 4 MG tablet   Oral   Take 8 mg by mouth daily with breakfast.         . Multiple Vitamins-Minerals (PRESERVISION AREDS 2 PO)   Oral   Take by mouth.         . predniSONE (STERAPRED UNI-PAK 21 TAB) 10 MG (21) TBPK tablet      Sig 6 tablet day 1, 5 tablets day 2, 4 tablets day 3,,3tablets day 4, 2 tablets day 5, 1 tablet day 6 take all tablets orally   21 tablet   0     Allergies Review of patient's allergies indicates no known allergies.  History reviewed. No pertinent family history.  Social History Social History  Substance  Use Topics  . Smoking status: Never Smoker   . Smokeless tobacco: None  . Alcohol Use: No    Review of Systems Constitutional: Negative for fever. Cardiovascular: Negative for chest pain. Respiratory: Positive shortness of breath, worse with exertion Gastrointestinal: Negative for abdominal pain, vomiting and diarrhea. Genitourinary: Negative for dysuria. Musculoskeletal: Positive lower extremity edema and weakness. Skin: Rash to lower extremities. Neurological: Negative for headache 10-point ROS otherwise negative.  ____________________________________________   PHYSICAL EXAM:  VITAL SIGNS: ED Triage Vitals  Enc Vitals Group     BP 04/13/15 1328 116/51 mmHg     Pulse Rate 04/13/15 1328 78     Resp 04/13/15 1328 20     Temp 04/13/15 1328 97.3 F (36.3 C)     Temp Source 04/13/15 1328 Oral     SpO2 04/13/15 1328 94 %     Weight 04/13/15 1328 168 lb (76.204 kg)     Height 04/13/15 1328 6' (1.829 m)     Head Cir --      Peak Flow --       Pain Score 04/13/15 1329 0     Pain Loc --      Pain Edu? --      Excl. in GC? --     Constitutional: Alert and oriented. Well appearing and in no distress. Eyes: Normal exam ENT   Head: Normocephalic and atraumatic.   Mouth/Throat: Mucous membranes are moist. Cardiovascular: Normal rate, regular rhythm. No murmur Respiratory: Normal respiratory effort mild tachypnea. Decreased breath sounds bilaterally, worse in the bases. No obvious rales or rhonchi. Gastrointestinal: Soft and nontender. No distention.   Musculoskeletal: 3+ pitting lower extremity edema bilaterally, with fluid from the left lower extremity consistent with weeping edema. Neurologic:  Normal speech and language. No gross focal neurologic deficits Skin:  Skin is warm Psychiatric: Mood and affect are normal. Speech and behavior are normal.  ____________________________________________    EKG  EKG reviewed and interpreted by myself shows sinus rhythm at 76 bpm, widened QRS, left axis deviation, nonspecific ST changes with lateral T-wave inversions. T-wave inversions are new from 09/2014.  ____________________________________________    RADIOLOGY  Knee and bilateral pleural effusions, interstitial edema.  ____________________________________________    INITIAL IMPRESSION / ASSESSMENT AND PLAN / ED COURSE  Pertinent labs & imaging results that were available during my care of the patient were reviewed by me and considered in my medical decision making (see chart for details).  Patient presents the emergency department worsening lower extremity edema, worsening shortness of breath worse with exertion. Denies chest pain. Was restarted on Lasix approximately one week ago. Lasix was doubled 5 days ago, patient continues to deteriorate with increased edema, increased shortness of breath. Patient has an EKG showing new T-wave inversions in the lateral leads. We will check labs dose IV Lasix, obtain a chest x-ray  and plan to admit to the hospital for further treatment and evaluation.  Troponin elevated at 0.05.  Bilateral pleural effusions with interstitial edema on chest x-ray, dosed IV Lasix, will admit to the hospital for further treatment.  ____________________________________________   FINAL CLINICAL IMPRESSION(S) / ED DIAGNOSES  CHF exacerbation   Minna AntisKevin Ines Warf, MD 04/13/15 1506

## 2015-04-14 ENCOUNTER — Inpatient Hospital Stay
Admit: 2015-04-14 | Discharge: 2015-04-14 | Disposition: A | Discharge status: Home / Self Care | Payer: Medicare Other | Attending: Internal Medicine | Admitting: Internal Medicine

## 2015-04-14 LAB — CBC
HCT: 38.9 % — ABNORMAL LOW (ref 40.0–52.0)
HEMOGLOBIN: 12.9 g/dL — AB (ref 13.0–18.0)
MCH: 28.5 pg (ref 26.0–34.0)
MCHC: 33.1 g/dL (ref 32.0–36.0)
MCV: 86 fL (ref 80.0–100.0)
Platelets: 169 10*3/uL (ref 150–440)
RBC: 4.52 MIL/uL (ref 4.40–5.90)
RDW: 15.9 % — ABNORMAL HIGH (ref 11.5–14.5)
WBC: 5.2 10*3/uL (ref 3.8–10.6)

## 2015-04-14 LAB — GLUCOSE, CAPILLARY
GLUCOSE-CAPILLARY: 212 mg/dL — AB (ref 65–99)
GLUCOSE-CAPILLARY: 161 mg/dL — AB (ref 65–99)
Glucose-Capillary: 111 mg/dL — ABNORMAL HIGH (ref 65–99)

## 2015-04-14 LAB — BASIC METABOLIC PANEL
Anion gap: 8 (ref 5–15)
BUN: 20 mg/dL (ref 6–20)
CHLORIDE: 98 mmol/L — AB (ref 101–111)
CO2: 28 mmol/L (ref 22–32)
Calcium: 8.2 mg/dL — ABNORMAL LOW (ref 8.9–10.3)
Creatinine, Ser: 1.76 mg/dL — ABNORMAL HIGH (ref 0.61–1.24)
GFR calc non Af Amer: 34 mL/min — ABNORMAL LOW (ref >60)
GFR, EST AFRICAN AMERICAN: 39 mL/min — AB (ref >60)
Glucose, Bld: 220 mg/dL — ABNORMAL HIGH (ref 65–99)
POTASSIUM: 3.8 mmol/L (ref 3.5–5.1)
SODIUM: 134 mmol/L — AB (ref 135–145)

## 2015-04-14 LAB — SEDIMENTATION RATE: SED RATE: 15 mm/h (ref 0–20)

## 2015-04-14 LAB — TROPONIN I: TROPONIN I: 0.05 ng/mL — AB (ref <0.031)

## 2015-04-14 MED ORDER — CEFAZOLIN SODIUM 1-5 GM-% IV SOLN
1.0000 g | Freq: Three times a day (TID) | INTRAVENOUS | Status: DC
  Administered 2015-04-14 – 2015-04-16 (×6): 1 g via INTRAVENOUS
  Filled 2015-04-14 (×8): qty 50

## 2015-04-14 NOTE — Progress Notes (Signed)
NSR. Room air. FS are stable. A & O. Family at the bedside.Urinal. Pt reports no pain.1 assist as needed. Pt has no further concerns at this time.

## 2015-04-14 NOTE — Consult Note (Signed)
[facility] Cardiology Consultation Note  Patient ID: Kenneth Massey, MRN: [id number], DOB/AGE: Jul 31, 1929 81 y.o. Admit date: 04/13/2015   Date of Consult: 04/14/2015 Primary Physician: Jose Persia, MD Primary Cardiologist: Jennette Dubin  Chief Complaint:  Chief Complaint  Patient presents with  . Leg Swelling  . Shortness of Breath   Reason for Consult: acute on chronic systolic dysfunction congestive heart failure  HPI: 81 y.o. male with known essential hypertension coronary artery disease status post previous coronary artery bypass graft peripheral vascular disease with AAA repair with diabetes with complication chronic kidney disease stage III with COPD having some significant progression of shortness of breath weakness and fatigue over the last 3-4 weeks increasing in culminating with severe lower extremity edema with ulcers most consistent with acute on chronic systolic dysfunction congestive heart failure with an elevated troponin at 0.05 more consistent with demand ischemia rather than acute coronary syndrome. The patient has been on previously appropriate medication management although compliance is a concern. He has been on beta blocker for cardiomyopathy but it is on possible that the patient is not using Lasix as appropriate. The patient was given intravenous Lasix and has had some improvements and shortness of breath and lower extremity edema at this time. There is been no evidence of chest discomfort consistent with unstable angina or myocardial infarction  Past Medical History  Diagnosis Date  . COPD (chronic obstructive pulmonary disease) (HCC)   . Hypertension   . DM type 2 (diabetes mellitus, type 2) (HCC)   . CKD (chronic kidney disease)   . CAD (coronary artery disease)   . Diabetes mellitus with neuropathy [facility])       Surgical History:  Past Surgical History  Procedure Laterality Date  . Coronary artery bypass graft    . Cholecystectomy    .  Abdominal aortic aneurysm repair    . Renal artery stent       Home Meds: Prior to Admission medications   Medication Sig Start Date End Date Taking? Authorizing Provider  aspirin EC 81 MG tablet Take 81 mg by mouth daily.   Yes Historical Provider, MD  carvedilol (COREG) 6.25 MG tablet Take 6.25 mg by mouth 2 (two) times daily with a meal.   Yes Historical Provider, MD  Cholecalciferol (VITAMIN D3) 2000 UNITS capsule Take 2,000 Units by mouth daily.   Yes Historical Provider, MD  Fluticasone-Salmeterol (ADVAIR) 250-50 MCG/DOSE AEPB Inhale 1 puff into the lungs 2 (two) times daily.   Yes Historical Provider, MD  furosemide (LASIX) 20 MG tablet Take 20 mg by mouth daily as needed. For fluid. 03/27/15  Yes Historical Provider, MD  glimepiride (AMARYL) 4 MG tablet Take 8 mg by mouth daily with breakfast.   Yes Historical Provider, MD  HUMULIN 70/30 (70-30) 100 UNIT/ML injection Inject 20 Units into the skin 2 (two) times daily before a meal. 04/11/15  Yes Historical Provider, MD  Multiple Vitamins-Minerals (PRESERVISION AREDS 2 PO) Take 1 capsule by mouth 2 (two) times daily.    Yes Historical Provider, MD  vitamin E (VITAMIN E) 1000 UNIT capsule Take 1,000 Units by mouth daily.   Yes Historical Provider, MD  albuterol (PROVENTIL HFA;VENTOLIN HFA) 108 (90 BASE) MCG/ACT inhaler Inhale 2 puffs into the lungs every 4 (four) hours as needed for wheezing or shortness of breath. 09/24/14   Hassan Rowan, MD  azithromycin (ZITHROMAX Z-PAK) 250 MG tablet Take 2 tablets first day and then 1 po a day for 4 days 09/24/14  Hassan Rowan, MD  predniSONE (STERAPRED UNI-PAK 21 TAB) 10 MG (21) TBPK tablet Sig 6 tablet day 1, 5 tablets day 2, 4 tablets day 3,,3tablets day 4, 2 tablets day 5, 1 tablet day 6 take all tablets orally 03/22/15   Hassan Rowan, MD    Inpatient Medications:  . aspirin EC  81 mg Oral Daily  . carvedilol  6.25 mg Oral BID WC  . cholecalciferol  2,000 Units Oral Daily  . enoxaparin (LOVENOX)  injection  40 mg Subcutaneous Q24H  . furosemide  40 mg Intravenous Q12H  . glimepiride  8 mg Oral Q breakfast  . insulin aspart  0-9 Units Subcutaneous TID WC  . insulin aspart protamine- aspart  10 Units Subcutaneous BID WC  . mometasone-formoterol  2 puff Inhalation BID  . sodium chloride  3 mL Intravenous Q12H  . sodium chloride  3 mL Intravenous Q12H  . vitamin E  1,000 Units Oral Daily      Allergies: No Known Allergies  Social History   Social History  . Marital Status: Married    Spouse Name: N/A  . Number of Children: N/A  . Years of Education: N/A   Occupational History  . Not on file.   Social History Main Topics  . Smoking status: Never Smoker   . Smokeless tobacco: Not on file  . Alcohol Use: No  . Drug Use: No  . Sexual Activity: Not on file   Other Topics Concern  . Not on file   Social History Narrative     Family History  Problem Relation Age of Onset  . Hypertension       Review of Systems Positive for shortness of breath lower extremity edema Negative for: General:  chills, fever, night sweats or weight changes.  Cardiovascular: PND orthopnea syncope dizziness  Dermatological skin lesions rashes Respiratory: Cough congestion Urologic: Frequent urination urination at night and hematuria Abdominal: negative for nausea, vomiting, diarrhea, bright red blood per rectum, melena, or hematemesis Neurologic: negative for visual changes, and/or hearing changes  All other systems reviewed and are otherwise negative except as noted above.  Labs:  Recent Labs  04/13/15 1337 04/13/15 1802 04/13/15 2349  TROPONINI 0.05* 0.05* 0.05*   Lab Results  Component Value Date   WBC 5.2 04/14/2015   HGB 12.9* 04/14/2015   HCT 38.9* 04/14/2015   MCV 86.0 04/14/2015   PLT 169 04/14/2015    Recent Labs Lab 04/14/15 0651  NA 134*  K 3.8  CL 98*  CO2 28  BUN 20  CREATININE 1.76*  CALCIUM 8.2*  GLUCOSE 220*   No results found for: CHOL, HDL,  LDLCALC, TRIG No results found for: DDIMER  Radiology/Studies:  Dg Chest 2 View  04/13/2015  CLINICAL DATA:  Shortness of Breath EXAM: CHEST - 2 VIEW COMPARISON:  09/30/2014 FINDINGS: Cardiac shadow is stable. Postsurgical changes are again noted. Bilateral pleural effusions left greater than right are seen. Mild interstitial changes are noted which may be related to edema. No focal infiltrate is seen. No acute bony abnormality is noted. IMPRESSION: New bilateral pleural effusions left greater than right, changes of mild interstitial edema Electronically Signed   By: Alcide Clever M.D.   On: 04/13/2015 14:32    EKG: Normal sinus rhythm  Weights: Filed Weights   04/13/15 1328  Weight: 168 lb (76.204 kg)     Physical Exam: Blood pressure 146/73, pulse 82, temperature 98.2 F (36.8 C), temperature source Oral, resp. rate 22, height 6' (1.829  m), weight 168 lb (76.204 kg), SpO2 94 %. Body mass index is 22.78 kg/(m^2). General: Well developed, well nourished, in no acute distress. Head eyes ears nose throat: Normocephalic, atraumatic, sclera non-icteric, no xanthomas, nares are without discharge. No apparent thyromegaly and/or mass  Lungs: Normal respiratory effort.  no wheezes, basilar rales, no rhonchi.  Heart: RRR with normal S1 soft S2. 3+ apical murmur gallop, no rub, PMI is normal size and placement, carotid upstroke normal with bruit, jugular venous pressure is normal Abdomen: Soft, non-tender, non-distended with normoactive bowel sounds. No hepatomegaly. No rebound/guarding. No obvious abdominal masses. Abdominal aorta is normal size without bruit Extremities: 2+ edema. no cyanosis, no clubbing, positive ulcers  Peripheral : 2+ bilateral upper extremity pulses, 1 + bilateral femoral pulses, 1 + bilateral dorsal pedal pulse Neuro: Alert and oriented. No facial asymmetry. No focal deficit. Moves all extremities spontaneously. Musculoskeletal: Normal muscle tone without kyphosis Psych:   Responds to questions appropriately with a normal affect.    Assessment: 81 year old male with chronic obstructive pulmonary disease essential hypertension coronary artery disease status post coronary bypass graft diabetes with complication peripheral vascular disease with abdominal aortic aneurysm repair with exacerbation and acute on chronic systolic dysfunction heart failure secondary to possible compliance issues dietary indiscretion and chronic kidney disease stage III with an elevated troponin consistent with demand ischemia rather than acute coronary syndrome  Plan: 1. Intravenous Lasix for lower extremity edema pulmonary edema and congestive heart failure 2. Genu carvedilol for hypertension control and cardiomyopathy 3. Echocardiogram for extent of LV systolic dysfunction valvular heart disease contributing to above with adjustments of medications 4. Further treatment of lower extremity ulcers with topical care 5. Dietary changes as necessary watching for low salt diet if able and 6. Begin ambulation and follow for other further significant symptoms but no other cardiac diagnostics necessary at this time  Signed, Lamar BlinksKOWALSKI,Eliyas Suddreth J M.D. [facility] 04/14/2015, 10:28 AM

## 2015-04-14 NOTE — Progress Notes (Signed)
[facility] [place] [facility]   PATIENT NAME: Kenneth BradyWilliam Massey    MR#:  [id number]  DATE OF BIRTH:  1930-02-20  SUBJECTIVE:  CHIEF COMPLAINT:   Chief Complaint  Patient presents with  . Leg Swelling  . Shortness of Breath   Still short of breath and requiring 2 L via nasal cannula.  REVIEW OF SYSTEMS:   Review of Systems  Constitutional: Positive for malaise/fatigue. Negative for fever and chills.  Respiratory: Positive for shortness of breath.   Cardiovascular: Positive for leg swelling. Negative for chest pain and palpitations.  Gastrointestinal: Negative for nausea, vomiting and abdominal pain.  Genitourinary: Negative for dysuria.    DRUG ALLERGIES:  No Known Allergies  VITALS:  Blood pressure 123/65, pulse 83, temperature 98.6 F (37 C), temperature source Oral, resp. rate 18, height 6' (1.829 m), weight 76.204 kg (168 lb), SpO2 94 %.  PHYSICAL EXAMINATION:  GENERAL:  81 y.o.-year-old patient lying in the bed with no acute distress.  LUNGS: Normal breath sounds bilaterally, no wheezing, rales,rhonchi. Fine bilateral crackles. No use of accessory muscles of respiration.  CARDIOVASCULAR: S1, S2 normal. No murmurs, rubs, or gallops.  ABDOMEN: Soft, nontender, nondistended. Bowel sounds present. No organomegaly or mass. No guarding no rebound EXTREMITIES: 2+ pedal edema, cyanosis, or clubbing.  NEUROLOGIC: Cranial nerves II through XII are intact. Muscle strength 5/5 in all extremities. Sensation intact. Gait not checked.  PSYCHIATRIC: The patient is alert and oriented x 3.  SKIN: Erythema over both lower extremities with satellite lesions, no blistering or other exudate   LABORATORY PANEL:   CBC  Recent Labs Lab 04/14/15 0651  WBC 5.2  HGB 12.9*  HCT 38.9*  PLT 169   ------------------------------------------------------------------------------------------------------------------  Chemistries   Recent Labs Lab  04/14/15 0651  NA 134*  K 3.8  CL 98*  CO2 28  GLUCOSE 220*  BUN 20  CREATININE 1.76*  CALCIUM 8.2*   ------------------------------------------------------------------------------------------------------------------  Cardiac Enzymes  Recent Labs Lab 04/13/15 2349  TROPONINI 0.05*   ------------------------------------------------------------------------------------------------------------------  RADIOLOGY:  Dg Chest 2 View  04/13/2015  CLINICAL DATA:  Shortness of Breath EXAM: CHEST - 2 VIEW COMPARISON:  09/30/2014 FINDINGS: Cardiac shadow is stable. Postsurgical changes are again noted. Bilateral pleural effusions left greater than right are seen. Mild interstitial changes are noted which may be related to edema. No focal infiltrate is seen. No acute bony abnormality is noted. IMPRESSION: New bilateral pleural effusions left greater than right, changes of mild interstitial edema Electronically Signed   By: Alcide CleverMark  Lukens M.D.   On: 04/13/2015 14:32    EKG:   Orders placed or performed during the hospital encounter of 04/13/15  . ED EKG within 10 minutes  . ED EKG within 10 minutes  . EKG 12-Lead  . EKG 12-Lead    ASSESSMENT AND PLAN:   1. Acute congestive heart failure - Echo report is pending - Cardiology following - Continue Lasix, is -1600 cc  2. Acute on Chronic kidney disease - Monitor closely with diuresis, electrolytes are stable  3. Bilateral lower extremity cellulitis - Blood cultures drawn today - Start Ancef  4. Diabetes mellitus type 2 - Check A1c - Hold oral hypoglycemics, continue sliding scale  5. COPD - No exacerbation at this time, continue Advair and albuterol as per home regimen  6. Hypertension - Controlled. Continue Coreg   All the records are reviewed and case discussed with Care Management/Social Workerr. Management plans discussed with the patient, family and they are  in agreement.  CODE STATUS: Full   TOTAL TIME TAKING CARE  OF THIS PATIENT: 20 minutes.  Greater than 50% of time spent in care coordination and counseling. POSSIBLE D/C IN 2 DAYS, DEPENDING ON CLINICAL CONDITION.   Elby Showers M.D on 04/14/2015 at 4:15 PM  Between 7am to 6pm - Pager - [phone]  After 6pm go to [email or web address] - password [email or web address] [facility]  [facility]  Office  [phone]  CC: Primary care physician; Jose Persia, MD

## 2015-04-15 LAB — CBC
HCT: 37.3 % — ABNORMAL LOW (ref 40.0–52.0)
Hemoglobin: 12.2 g/dL — ABNORMAL LOW (ref 13.0–18.0)
MCH: 28.3 pg (ref 26.0–34.0)
MCHC: 32.7 g/dL (ref 32.0–36.0)
MCV: 86.3 fL (ref 80.0–100.0)
PLATELETS: 172 10*3/uL (ref 150–440)
RBC: 4.32 MIL/uL — AB (ref 4.40–5.90)
RDW: 15.8 % — ABNORMAL HIGH (ref 11.5–14.5)
WBC: 4.7 10*3/uL (ref 3.8–10.6)

## 2015-04-15 LAB — BASIC METABOLIC PANEL
Anion gap: 7 (ref 5–15)
BUN: 22 mg/dL — AB (ref 6–20)
CO2: 32 mmol/L (ref 22–32)
CREATININE: 1.7 mg/dL — AB (ref 0.61–1.24)
Calcium: 8.3 mg/dL — ABNORMAL LOW (ref 8.9–10.3)
Chloride: 97 mmol/L — ABNORMAL LOW (ref 101–111)
GFR calc Af Amer: 41 mL/min — ABNORMAL LOW (ref >60)
GFR, EST NON AFRICAN AMERICAN: 35 mL/min — AB (ref >60)
GLUCOSE: 91 mg/dL (ref 65–99)
POTASSIUM: 3.3 mmol/L — AB (ref 3.5–5.1)
Sodium: 136 mmol/L (ref 135–145)

## 2015-04-15 LAB — GLUCOSE, CAPILLARY
GLUCOSE-CAPILLARY: 316 mg/dL — AB (ref 65–99)
GLUCOSE-CAPILLARY: 79 mg/dL (ref 65–99)
Glucose-Capillary: 128 mg/dL — ABNORMAL HIGH (ref 65–99)
Glucose-Capillary: 86 mg/dL (ref 65–99)
Glucose-Capillary: 165 mg/dL — ABNORMAL HIGH (ref 65–99)

## 2015-04-15 LAB — HEMOGLOBIN A1C: Hgb A1c MFr Bld: 10.8 % — ABNORMAL HIGH (ref 4.0–6.0)

## 2015-04-15 NOTE — Progress Notes (Signed)
Pt is alert and oriented x 4, continues on 2 L oxygen and desats on room air with exertion, good appetite, denies pain, ambulated with physical therapy, up in room with stand by assist, continues on IV lasix, receiving IV antibiotics, BUN and creatinine slightly elevated, FSBS elevated covered with insulin, possible d/c on 11/20 to home.

## 2015-04-15 NOTE — Evaluation (Signed)
Physical Therapy Evaluation Patient Details Name: Gregary SignsWilliam E Tinkle MRN: [id number] DOB: 03/31/1930 Today's Date: 04/15/2015   History of Present Illness  Patient is an 81 y/o male that was admitted with shortness of breath and redness/swelling in his LEs (likely cellulitis).   Clinical Impression  Patient has previously been quite active prior to this admission, and demonstrates home mobility ambulation tolerance. He does demonstrate significantly decreased gait speed, balance, and gait mechanics as well as one significant loss of balance requiring PT assistance. Given the above, PT had patient ambulate again with RW and no noted balance deficits and significantly increased gait speed. Patient strongly recommended to use RW and have HHPT work with him after discharge to increase safety with OOB mobility. Patient maintained O2 sats between 93-98% on 2L during ambulation.     Follow Up Recommendations Home health PT    Equipment Recommendations  Rolling walker with 5" wheels    Recommendations for Other Services       Precautions / Restrictions Precautions Precautions: Fall Restrictions Weight Bearing Restrictions: No      Mobility  Bed Mobility Overal bed mobility: Modified Independent             General bed mobility comments: Patient uses bedrails, no need for cuing or balance deficits.   Transfers Overall transfer level: Needs assistance Equipment used: Rolling walker (2 wheeled) Transfers: Sit to/from Stand Sit to Stand: Supervision         General transfer comment: Patient does not lose balance or require any assistance to stabilize in sit to stand transfer.   Ambulation/Gait Ambulation/Gait assistance: Supervision Ambulation Distance (Feet): 200 Feet (200' without RW and 200' with RW)   Gait Pattern/deviations: Drifts right/left;Decreased step length - right;Decreased step length - left   Gait velocity interpretation: Below normal speed for  age/gender General Gait Details: Patient demonstrates drifting/lateral loss of balance during ambulation secondary to generalized weakness. Step length/placement was variable throughout ambulation indicating high falls risk.   Stairs            Wheelchair Mobility    Modified Rankin (Stroke Patients Only)       Balance Overall balance assessment: Needs assistance           Standing balance-Leahy Scale: Fair Standing balance comment: Modified DGI of 8/12 indicating high falls risk. Tinetti - 22/28 indicating higher risk for falling. With RW no balance deficits.                              Pertinent Vitals/Pain Pain Assessment: No/denies pain    Home Living Family/patient expects to be discharged to:: Private residence Living Arrangements: Spouse/significant other Available Help at Discharge: Family Type of Home: House Home Access: Ramped entrance     Home Layout: One level Home Equipment: Environmental consultantWalker - 2 wheels      Prior Function Level of Independence: Independent         Comments: Patient reports no falls recently and appears to be a reliable historian.      Hand Dominance        Extremity/Trunk Assessment   Upper Extremity Assessment: Overall WFL for tasks assessed           Lower Extremity Assessment: Overall WFL for tasks assessed         Communication   Communication: No difficulties  Cognition Arousal/Alertness: Awake/alert Behavior During Therapy: WFL for tasks assessed/performed Overall Cognitive Status: Within Functional Limits for tasks  assessed                      General Comments General comments (skin integrity, edema, etc.): Redness bilaterally in LEs.     Exercises        Assessment/Plan    PT Assessment Patient needs continued PT services  PT Diagnosis Difficulty walking;Generalized weakness   PT Problem List Decreased strength;Decreased knowledge of use of DME;Decreased activity  tolerance;Cardiopulmonary status limiting activity;Decreased balance;Decreased mobility;Decreased safety awareness  PT Treatment Interventions DME instruction;Gait training;Therapeutic activities;Therapeutic exercise;Balance training   PT Goals (Current goals can be found in the Care Plan section) Acute Rehab PT Goals Patient Stated Goal: To return to home mobility.  PT Goal Formulation: With patient Time For Goal Achievement: 04/29/15 Potential to Achieve Goals: Good    Frequency Min 2X/week   Barriers to discharge Decreased caregiver support Patient would likely require 24/7 assist to remind him to use RW.     Co-evaluation               End of Session Equipment Utilized During Treatment: Gait belt Activity Tolerance: Patient tolerated treatment well Patient left: in chair;with call bell/phone within reach;with chair alarm set Nurse Communication: Mobility status         Time: 1545-1610 PT Time Calculation (min) (ACUTE ONLY): 25 min   Charges:   PT Evaluation $Initial PT Evaluation Tier I: 1 Procedure     PT G Codes:        Kerin Ransom, PT, DPT    04/15/2015, 4:51 PM

## 2015-04-15 NOTE — Progress Notes (Signed)
Initial Nutrition Assessment   INTERVENTION:   Meals and Snacks: Cater to patient preferences. Recommend Heart Healthy/Carb Modified diet order as pt diabetic requesting sugar free items. Heart healthy to include 2g Na diet order as well.  Medical Food Supplement Therapy: will recommend on follow if intake poor Education: pt knowledgeable of low sodium nutrition therapy as well as diabetic nutrition therapy   NUTRITION DIAGNOSIS:   No nutrition therapy at this time  GOAL:   Patient will meet greater than or equal to 90% of their needs  MONITOR:    (Energy Intake, Glucose Profile, electrolyte and Renal Profile, Anthropometrics, Digestive System)  REASON FOR ASSESSMENT:   Diagnosis    ASSESSMENT:   Pt admitted with SOB, swelling of lower extremities secondary to CHF.   Past Medical History  Diagnosis Date  . COPD (chronic obstructive pulmonary disease) (HCC)   . Hypertension   . DM type 2 (diabetes mellitus, type 2) (HCC)   . CKD (chronic kidney disease)   . CAD (coronary artery disease)   . Diabetes mellitus with neuropathy (HCC)     Diet Order:  Diet heart healthy/carb modified Room service appropriate?: Yes; Fluid consistency:: Thin    Current Nutrition: Pt ate 100% of chicken salad sandwich, yogurt and sweet tea for lunch today, reports eating a big breakfast this am as well. Pt reports drinking orange juice this am as he having low blood sugars.  Food/Nutrition-Related History: Pt reports good appetite PTA eating 3 meals per day. Pt reports eating sugar free items for his blood sugar control, pt reports checking blood sugars PTA. Pt reports also being aware of low sodium nutrition therapy and doing PTA.    Scheduled Medications:  . aspirin EC  81 mg Oral Daily  . carvedilol  6.25 mg Oral BID WC  .  ceFAZolin (ANCEF) IV  1 g Intravenous 3 times per day  . cholecalciferol  2,000 Units Oral Daily  . enoxaparin (LOVENOX) injection  40 mg Subcutaneous Q24H  .  furosemide  40 mg Intravenous Q12H  . insulin aspart  0-9 Units Subcutaneous TID WC  . insulin aspart protamine- aspart  10 Units Subcutaneous BID WC  . mometasone-formoterol  2 puff Inhalation BID  . sodium chloride  3 mL Intravenous Q12H  . sodium chloride  3 mL Intravenous Q12H  . vitamin E  1,000 Units Oral Daily     Electrolyte/Renal Profile and Glucose Profile:   Recent Labs Lab 04/13/15 1337 04/14/15 0651 04/15/15 0421  NA 134* 134* 136  K 3.2* 3.8 3.3*  CL 95* 98* 97*  CO2 28 28 32  BUN 20 20 22*  CREATININE 1.65* 1.76* 1.70*  CALCIUM 8.5* 8.2* 8.3*  GLUCOSE 82 220* 91   Protein Profile: No results for input(s): ALBUMIN in the last 168 hours.  Gastrointestinal Profile: Last BM:  04/14/2015   Weight Change: Pt reports weighing 156lbs on admission. Pt reports weight of up to 168lbs a few weeks PTA but that it had progressively been going back down. Per [facility] slo weight loss generally since May (5% weight loss in 7 months)    Height:   Ht Readings from Last 1 Encounters:  04/13/15 6' (1.829 m)    Weight:   Wt Readings from Last 1 Encounters:  04/15/15 156 lb 6.4 oz (70.943 kg)    Wt Readings from Last 10 Encounters:  04/15/15 156 lb 6.4 oz (70.943 kg)  03/22/15 156 lb (70.761 kg)  09/30/14 160 lb (72.576 kg)  09/24/14 164 lb (74.39 kg)     BMI:  Body mass index is 21.21 kg/(m^2).   EDUCATION NEEDS:   Education needs addressed as pt knowledgeable in low sodium and diabetic nutrition therapy on visit today   LOW Care Level  Leda Quail, [place], LDN Pager [phone]

## 2015-04-15 NOTE — Progress Notes (Signed)
 .  qualSATURATION QUALIFICATIONS: (This note is used to comply with regulatory documentation for home oxygen)  Patient Saturations on Room Air at Rest = 91%  Patient Saturations on Room Air while Ambulating = 78%  Patient Saturations on 2 Liters of oxygen while Ambulating = 91%  Please briefly explain why patient needs home oxygen:

## 2015-04-15 NOTE — Progress Notes (Signed)
[facility]  Patient: Kenneth Massey / Admit Date: 04/13/2015 / Date of Encounter: 04/15/2015, 10:20 AM   Subjective: Still sob but with significant improvements  No cp  Review of Systems: Positive for:sob Negative for: Vision change, hearing change, syncope, dizziness, nausea, vomiting,diarrhea, bloody stool, stomach pain, cough, congestion, diaphoresis, urinary frequency, urinary pain,skin lesions, skin rashes Others previously listed  Objective: Telemetry: nsr Physical Exam: Blood pressure 125/57, pulse 80, temperature 98.9 F (37.2 C), temperature source Oral, resp. rate 24, height 6' (1.829 m), weight 156 lb 6.4 oz (70.943 kg), SpO2 97 %. Body mass index is 21.21 kg/(m^2). General: Well developed, well nourished, in no acute distress. Head: Normocephalic, atraumatic, sclera non-icteric, no xanthomas, nares are without discharge. Neck: No apparent masses Lungs: Normal respirations with few wheezes, no rhonchi, no rales , basilar  crackles   Heart: Regular rate and rhythm, normal S1 S2, 3-4+mr  murmur, no rub, no gallop, PMI is normal size and placement, carotid upstroke normal with  bruit, jugular venous pressure normal Abdomen: Soft, non-tender, non-distended with normoactive bowel sounds. No hepatosplenomegaly. Abdominal aorta is normal size without bruit Extremities: 1-2+ edema, no clubbing, no cyanosis,+ ulcers,  Peripheral: 2+ radial, 1+ femoral, 0+ dorsal pedal pulses Neuro: Alert and oriented. Moves all extremities spontaneously. Psych:  Responds to questions appropriately with a normal affect.   Intake/Output Summary (Last 24 hours) at 04/15/15 1020 Last data filed at 04/15/15 1009  Gross per 24 hour  Intake    600 ml  Output   2200 ml  Net  -1600 ml    Inpatient Medications:  . aspirin EC  81 mg Oral Daily  . carvedilol  6.25 mg Oral BID WC  .  ceFAZolin (ANCEF) IV  1 g Intravenous 3 times per day  . cholecalciferol   2,000 Units Oral Daily  . enoxaparin (LOVENOX) injection  40 mg Subcutaneous Q24H  . furosemide  40 mg Intravenous Q12H  . insulin aspart  0-9 Units Subcutaneous TID WC  . insulin aspart protamine- aspart  10 Units Subcutaneous BID WC  . mometasone-formoterol  2 puff Inhalation BID  . sodium chloride  3 mL Intravenous Q12H  . sodium chloride  3 mL Intravenous Q12H  . vitamin E  1,000 Units Oral Daily   Infusions:    Labs:  Recent Labs  04/14/15 0651 04/15/15 0421  NA 134* 136  K 3.8 3.3*  CL 98* 97*  CO2 28 32  GLUCOSE 220* 91  BUN 20 22*  CREATININE 1.76* 1.70*  CALCIUM 8.2* 8.3*   No results for input(s): AST, ALT, ALKPHOS, BILITOT, PROT, ALBUMIN in the last 72 hours.  Recent Labs  04/14/15 0651 04/15/15 0421  WBC 5.2 4.7  HGB 12.9* 12.2*  HCT 38.9* 37.3*  MCV 86.0 86.3  PLT 169 172    Recent Labs  04/13/15 1337 04/13/15 1802 04/13/15 2349  TROPONINI 0.05* 0.05* 0.05*   Invalid input(s): POCBNP No results for input(s): HGBA1C in the last 72 hours.   Weights: Filed Weights   04/13/15 1328 04/14/15 0409 04/15/15 0448  Weight: 168 lb (76.204 kg) 159 lb 3.2 oz (72.213 kg) 156 lb 6.4 oz (70.943 kg)     Radiology/Studies:  Dg Chest 2 View  04/13/2015  CLINICAL DATA:  Shortness of Breath EXAM: CHEST - 2 VIEW COMPARISON:  09/30/2014 FINDINGS: Cardiac shadow is stable. Postsurgical changes are again noted. Bilateral pleural effusions left greater than right are seen. Mild interstitial changes are noted which  may be related to edema. No focal infiltrate is seen. No acute bony abnormality is noted. IMPRESSION: New bilateral pleural effusions left greater than right, changes of mild interstitial edema Electronically Signed   By: Alcide Clever M.D.   On: 04/13/2015 14:32     Assessment and Recommendation  81 y.o. male with essential htn dm cabg and acute chf with severe lower extremity edema multifactoral in nature now slightly improved and no curent evidence of  angina or mi 1.continue lasix iv and change ot oral later 2.b-blocker for chf 3. Echo pentding 4. Low sodium diet 5.no further interventions at this time 6. Ambulation and follow for need in adjustments and possible dc to home with close follow up  Signed, Arnoldo Hooker M.D. FACC

## 2015-04-15 NOTE — Progress Notes (Signed)
[facility] [place] [facility]   PATIENT NAME: Kenneth Massey    MR#:  [id number]  DATE OF BIRTH:  April 03, 1930  SUBJECTIVE:  CHIEF COMPLAINT:   Chief Complaint  Patient presents with  . Leg Swelling  . Shortness of Breath   Feeling better, breathing and energy better. Swelling decreased.  REVIEW OF SYSTEMS:   Review of Systems  Constitutional: Positive for malaise/fatigue. Negative for fever and chills.  Respiratory: Positive for shortness of breath.   Cardiovascular: Positive for leg swelling. Negative for chest pain and palpitations.  Gastrointestinal: Negative for nausea, vomiting and abdominal pain.  Genitourinary: Negative for dysuria.   DRUG ALLERGIES:  No Known Allergies  VITALS:  Blood pressure 120/64, pulse 78, temperature 97.9 F (36.6 C), temperature source Oral, resp. rate 16, height 6' (1.829 m), weight 70.943 kg (156 lb 6.4 oz), SpO2 98 %.  PHYSICAL EXAMINATION:  GENERAL:  81 y.o.-year-old patient lying in the bed with no acute distress.  LUNGS: Normal breath sounds bilaterally, no wheezing, rales, rhonchi. Fine bilateral crackles. No use of accessory muscles of respiration.  CARDIOVASCULAR: S1, S2 normal. No murmurs, rubs, or gallops.  ABDOMEN: Soft, nontender, nondistended. Bowel sounds present. No organomegaly or mass. No guarding no rebound EXTREMITIES: 2+ pedal edema, cyanosis, or clubbing.  NEUROLOGIC: Cranial nerves II through XII are intact. Muscle strength 5/5 in all extremities. Sensation intact. Gait not checked.  PSYCHIATRIC: The patient is alert and oriented x 3.  SKIN: Erythema over both lower extremities with satellite lesions, no blistering or other exudate  LABORATORY PANEL:   CBC  Recent Labs Lab 04/15/15 0421  WBC 4.7  HGB 12.2*  HCT 37.3*  PLT 172   ------------------------------------------------------------------------------------------------------------------  Chemistries   Recent Labs Lab  04/15/15 0421  NA 136  K 3.3*  CL 97*  CO2 32  GLUCOSE 91  BUN 22*  CREATININE 1.70*  CALCIUM 8.3*   ------------------------------------------------------------------------------------------------------------------  Cardiac Enzymes  Recent Labs Lab 04/13/15 2349  TROPONINI 0.05*   ------------------------------------------------------------------------------------------------------------------  RADIOLOGY:  Dg Chest 2 View  04/13/2015  CLINICAL DATA:  Shortness of Breath EXAM: CHEST - 2 VIEW COMPARISON:  09/30/2014 FINDINGS: Cardiac shadow is stable. Postsurgical changes are again noted. Bilateral pleural effusions left greater than right are seen. Mild interstitial changes are noted which may be related to edema. No focal infiltrate is seen. No acute bony abnormality is noted. IMPRESSION: New bilateral pleural effusions left greater than right, changes of mild interstitial edema Electronically Signed   By: Alcide Clever M.D.   On: 04/13/2015 14:32    EKG:   Orders placed or performed during the hospital encounter of 04/13/15  . ED EKG within 10 minutes  . ED EKG within 10 minutes  . EKG 12-Lead  . EKG 12-Lead    ASSESSMENT AND PLAN:   1. Acute congestive heart failure - Echo report is pending - Cardiology following - Continue Lasix, is -1600 cc  2. Acute on Chronic kidney disease - Monitor closely with diuresis, electrolytes are stable  3. Bilateral lower extremity cellulitis: imrpving - Blood cultures drawn today - continue Ancef  4. Diabetes mellitus type 2: cbg;s controlled - Check A1c - Hold oral hypoglycemics, continue sliding scale  5. COPD - No exacerbation at this time, continue Advair and albuterol as per home regimen  6. Hypertension - Controlled. Continue Coreg  7. Obtain PT consultation   All the records are reviewed and case discussed with Care Management/Social Workerr. Management plans discussed with  the patient, family and they are in  agreement.  CODE STATUS: Full   TOTAL TIME TAKING CARE OF THIS PATIENT: 20 minutes.  Greater than 50% of time spent in care coordination and counseling. POSSIBLE D/C IN 2 DAYS, DEPENDING ON CLINICAL CONDITION.   Elby ShowersWALSH, Garnetta Fedrick M.D on 04/15/2015 at 2:03 PM  Between 7am to 6pm - Pager - [phone]  After 6pm go to [email or web address] - password [email or web address] [email or web address]  Office  [phone]  CC: Primary care physician; Jose PersiaHONDROS, DIMITRIOS P, MD

## 2015-04-15 NOTE — Care Management Important Message (Signed)
Important Message  Patient Details  Name: Gregary SignsWilliam E Pereida MRN: [id number] Date of Birth: 1929-05-22   Medicare Important Message Given:  Yes    Olegario MessierKathy A Atarah Cadogan 04/15/2015, 1:03 PM

## 2015-04-16 LAB — BASIC METABOLIC PANEL
Anion gap: 7 (ref 5–15)
BUN: 28 mg/dL — AB (ref 6–20)
CALCIUM: 8.5 mg/dL — AB (ref 8.9–10.3)
CHLORIDE: 96 mmol/L — AB (ref 101–111)
CO2: 33 mmol/L — AB (ref 22–32)
CREATININE: 1.8 mg/dL — AB (ref 0.61–1.24)
GFR calc non Af Amer: 33 mL/min — ABNORMAL LOW (ref >60)
GFR, EST AFRICAN AMERICAN: 38 mL/min — AB (ref >60)
Glucose, Bld: 172 mg/dL — ABNORMAL HIGH (ref 65–99)
Potassium: 3.7 mmol/L (ref 3.5–5.1)
SODIUM: 136 mmol/L (ref 135–145)

## 2015-04-16 LAB — CBC
HCT: 36.7 % — ABNORMAL LOW (ref 40.0–52.0)
Hemoglobin: 12.4 g/dL — ABNORMAL LOW (ref 13.0–18.0)
MCH: 29.3 pg (ref 26.0–34.0)
MCHC: 33.7 g/dL (ref 32.0–36.0)
MCV: 86.8 fL (ref 80.0–100.0)
Platelets: 181 10*3/uL (ref 150–440)
RBC: 4.22 MIL/uL — ABNORMAL LOW (ref 4.40–5.90)
RDW: 16.2 % — AB (ref 11.5–14.5)
WBC: 5.4 10*3/uL (ref 3.8–10.6)

## 2015-04-16 LAB — GLUCOSE, CAPILLARY
GLUCOSE-CAPILLARY: 66 mg/dL (ref 65–99)
GLUCOSE-CAPILLARY: 163 mg/dL — AB (ref 65–99)
Glucose-Capillary: 262 mg/dL — ABNORMAL HIGH (ref 65–99)

## 2015-04-16 MED ORDER — CEPHALEXIN 500 MG PO CAPS: 500.0000 mg | ORAL_CAPSULE | Freq: Three times a day (TID) | ORAL | Status: DC

## 2015-04-16 MED ORDER — HUMULIN 70/30 (70-30) 100 UNIT/ML ~~LOC~~ SUSP: 25.0000 [IU] | Freq: Two times a day (BID) | SUBCUTANEOUS | Status: DC

## 2015-04-16 MED ORDER — FUROSEMIDE 20 MG PO TABS: 20.0000 mg | ORAL_TABLET | Freq: Every day | ORAL | Status: DC

## 2015-04-16 NOTE — Discharge Instructions (Signed)
[facility] appointment on May 14, 2015 at 9:00am with Kenneth Kindredina Nkosi Cortright, FNP. Please call [phone] to reschedule.

## 2015-04-16 NOTE — Care Management (Signed)
Patient is for discharge home today.  Physical therapy has recommended home health physical therapy.  Patient would also benefit from home health nursing.  Patient also qualified for home oxygen.  Says he has had home 02 in the past.  Agency preference is [facility] as he received services through agency in 2013.  Patient lives at home with his wife and is independent in all adls.  He is able to drive.  Denies issues accessing medical care or paying for meds.  he clarifies that his PCP is Dr Marcello FennelHande.  Corrected this in Epic.   Confirms he was a walker at home.  Confirms his address and phone number.  Referral to [facility] for nursing, physical therapy and oxygen

## 2015-04-16 NOTE — Progress Notes (Signed)
Initial appointment made at the [facility] Clinic on May 14, 2015 at 9:00am. Thank you.

## 2015-04-16 NOTE — Discharge Summary (Signed)
[facility] [place] [facility]  DISCHARGE SUMMARY   PATIENT NAME: Kenneth Massey    MR#:  [id number]  DATE OF BIRTH:  1930/02/21  DATE OF ADMISSION:  04/13/2015 ADMITTING PHYSICIAN: Auburn BilberryShreyang Patel, MD  DATE OF DISCHARGE: 04/16/2015  PRIMARY CARE PHYSICIAN: Barbette ReichmannHANDE,VISHWANATH, MD    ADMISSION DIAGNOSIS:  Pleural effusion [J90] Acute on chronic congestive heart failure, unspecified congestive heart failure type (HCC) [I50.9]  DISCHARGE DIAGNOSIS:  Active Problems:   CHF (congestive heart failure) (HCC)   SECONDARY DIAGNOSIS:   Past Medical History  Diagnosis Date  . COPD (chronic obstructive pulmonary disease) (HCC)   . Hypertension   . DM type 2 (diabetes mellitus, type 2) (HCC)   . CKD (chronic kidney disease)   . CAD (coronary artery disease)   . Diabetes mellitus with neuropathy (HCC)     HOSPITAL COURSE:    1. Acute congestive heart failure: Echo report with EF 45-50%, no change from prior. Did well with diuresis -4 L by the time of discharge. Was followed by his cardiologist during the hospitalization. He will be discharged on daily Lasix 20mg . Will have home health to assist with daily weights and monitoring. Will follow up with cardiology within the next 2 weeks  2. Acute on Chronic kidney disease: Renal function remained fairly stable with diuresis. Electrolytes stable.  3. Bilateral lower extremity cellulitis: Edema due to to congestive heart failure exacerbation. Had significant erythema. Blood cultures negative. Started on Keflex for cellulitis. Erythema improved. No open wounds  4. Diabetes mellitus type 2: Hemoglobin A1c 10.8, poor outpatient control of diabetes. Discontinued amaryl due to CHF. Increased dose of 70/30. Will need to follow up with PCP for further management.  5. COPD - No exacerbation at this time, continue Advair and albuterol as per home regimen  6. Hypertension - Controlled. Continue Coreg  7.  Deconditioning: Physical therapy as recommended home health physical therapy and a walker. These have been ordered  DISCHARGE CONDITIONS:   Stable  CONSULTS OBTAINED:  Treatment Team:  Auburn BilberryShreyang Patel, MD Lamar BlinksBruce J Kowalski, MD  DRUG ALLERGIES:  No Known Allergies  DISCHARGE MEDICATIONS:   Discharge Medication List as of 04/16/2015  2:32 PM    START taking these medications   Details  cephALEXin (KEFLEX) 500 MG capsule Take 1 capsule (500 mg total) by mouth 3 (three) times daily., Starting 04/16/2015, Until Discontinued, Print      CONTINUE these medications which have CHANGED   Details  furosemide (LASIX) 20 MG tablet Take 1 tablet (20 mg total) by mouth daily. For fluid., Starting 04/16/2015, Until Discontinued, Normal    HUMULIN 70/30 (70-30) 100 UNIT/ML injection Inject 25 Units into the skin 2 (two) times daily before a meal., Starting 04/16/2015, Until Discontinued, Normal      CONTINUE these medications which have NOT CHANGED   Details  aspirin EC 81 MG tablet Take 81 mg by mouth daily., Until Discontinued, Historical Med    carvedilol (COREG) 6.25 MG tablet Take 6.25 mg by mouth 2 (two) times daily with a meal., Until Discontinued, Historical Med    Cholecalciferol (VITAMIN D3) 2000 UNITS capsule Take 2,000 Units by mouth daily., Until Discontinued, Historical Med    Fluticasone-Salmeterol (ADVAIR) 250-50 MCG/DOSE AEPB Inhale 1 puff into the lungs 2 (two) times daily., Until Discontinued, Historical Med    Multiple Vitamins-Minerals (PRESERVISION AREDS 2 PO) Take 1 capsule by mouth 2 (two) times daily. , Until Discontinued, Historical Med    vitamin E (VITAMIN E)  1000 UNIT capsule Take 1,000 Units by mouth daily., Until Discontinued, Historical Med    albuterol (PROVENTIL HFA;VENTOLIN HFA) 108 (90 BASE) MCG/ACT inhaler Inhale 2 puffs into the lungs every 4 (four) hours as needed for wheezing or shortness of breath., Starting 09/24/2014, Until Discontinued, Print       STOP taking these medications     glimepiride (AMARYL) 4 MG tablet      azithromycin (ZITHROMAX Z-PAK) 250 MG tablet      predniSONE (STERAPRED UNI-PAK 21 TAB) 10 MG (21) TBPK tablet          DISCHARGE INSTRUCTIONS:    Home health physical therapy and nursing. Stable condition. Diabetic heart healthy diet. Low-sodium diet.  If you experience worsening of your admission symptoms, develop shortness of breath, life threatening emergency, suicidal or homicidal thoughts you must seek medical attention immediately by calling 911 or calling your MD immediately  if symptoms less severe.  You Must read complete instructions/literature along with all the possible adverse reactions/side effects for all the Medicines you take and that have been prescribed to you. Take any new Medicines after you have completely understood and accept all the possible adverse reactions/side effects.   Please note  You were cared for by a hospitalist during your hospital stay. If you have any questions about your discharge medications or the care you received while you were in the hospital after you are discharged, you can call the unit and asked to speak with the hospitalist on call if the hospitalist that took care of you is not available. Once you are discharged, your primary care physician will handle any further medical issues. Please note that NO REFILLS for any discharge medications will be authorized once you are discharged, as it is imperative that you return to your primary care physician (or establish a relationship with a primary care physician if you do not have one) for your aftercare needs so that they can reassess your need for medications and monitor your lab values.    Today   CHIEF COMPLAINT:   Chief Complaint  Patient presents with  . Leg Swelling  . Shortness of Breath    HISTORY OF PRESENT ILLNESS:  [name] is a 81 y.o. male with a known history of COPD, hypertension, chronic  kidney disease, coronary artery disease, diabetes who has had shortness of breath for the past 2 weeks. He has been treated for COPD exasperation with prednisone. However his breathing troubles continued. He states that his shortness of breath got worst over the past few days. He's also noticed swelling in the past 2 weeks of his lower extremity. He has some weeping from his legs. He is unable to lay flat for the past 2 weeks. Has to sleep propped up. He also has gained 16 pounds. He has not had any chest pain. No coughing no wheezing. Patient came to the emergency room with the symptoms chest x-ray suggestive of CHF. Also BMP is elevated.  VITAL SIGNS:  Blood pressure 120/61, pulse 91, temperature 98.1 F (36.7 C), temperature source Oral, resp. rate 19, height 6' (1.829 m), weight 69.763 kg (153 lb 12.8 oz), SpO2 91 %.  I/O:   Intake/Output Summary (Last 24 hours) at 04/16/15 1600 Last data filed at 04/16/15 1354  Gross per 24 hour  Intake    530 ml  Output   1100 ml  Net   -570 ml    PHYSICAL EXAMINATION:  GENERAL:  81 y.o.-year-old patient lying in the bed  with no acute distress.  LUNGS: Normal breath sounds bilaterally, no wheezing, rales,rhonchi or crepitation. No use of accessory muscles of respiration.  CARDIOVASCULAR: S1, S2 normal. 3/6 SEM no rubs, or gallops.  ABDOMEN: Soft, non-tender, non-distended. Bowel sounds present. No organomegaly or mass.  EXTREMITIES: +1 pedal edema, cyanosis, or clubbing.  NEUROLOGIC: Cranial nerves II through XII are intact. Muscle strength 5/5 in all extremities. Sensation intact. Gait not checked.  PSYCHIATRIC: The patient is alert and oriented x 3.  SKIN: Erythema over both lower extremities, improving   DATA REVIEW:   CBC  Recent Labs Lab 04/16/15 0502  WBC 5.4  HGB 12.4*  HCT 36.7*  PLT 181    Chemistries   Recent Labs Lab 04/16/15 0502  NA 136  K 3.7  CL 96*  CO2 33*  GLUCOSE 172*  BUN 28*  CREATININE 1.80*  CALCIUM 8.5*     Cardiac Enzymes  Recent Labs Lab 04/13/15 2349  TROPONINI 0.05*    Microbiology Results  Results for orders placed or performed during the hospital encounter of 04/13/15  Culture, blood (Routine X 2) w Reflex to ID Panel     Status: None (Preliminary result)   Collection Time: 04/14/15  4:42 PM  Result Value Ref Range Status   Specimen Description BLOOD RIGHT HAND  Final   Special Requests BOTTLES DRAWN AEROBIC AND ANAEROBIC  6CC  Final   Culture NO GROWTH 2 DAYS  Final   Report Status PENDING  Incomplete  Culture, blood (Routine X 2) w Reflex to ID Panel     Status: None (Preliminary result)   Collection Time: 04/14/15  4:42 PM  Result Value Ref Range Status   Specimen Description BLOOD LEFT AC  Final   Special Requests BOTTLES DRAWN AEROBIC AND ANAEROBIC  7CC  Final   Culture NO GROWTH 2 DAYS  Final   Report Status PENDING  Incomplete    RADIOLOGY:  No results found.  EKG:   Orders placed or performed during the hospital encounter of 04/13/15  . ED EKG within 10 minutes  . ED EKG within 10 minutes  . EKG 12-Lead  . EKG 12-Lead      Management plans discussed with the patient, family and they are in agreement.  CODE STATUS:     Code Status Orders        Start     Ordered   04/13/15 1747  Full code   Continuous     04/13/15 1747    Advance Directive Documentation        Most Recent Value   Type of Advance Directive  Healthcare Power of Attorney   Pre-existing out of facility DNR order (yellow form or pink MOST form)     "MOST" Form in Place?        TOTAL TIME TAKING CARE OF THIS PATIENT: 35 minutes.  Greater than 50% of time spent in care coordination and counseling.  Elby Showers M.D on 04/16/2015 at 4:00 PM  Between 7am to 6pm - Pager - [phone]  After 6pm go to [email or web address] - password [email or web address] [facility]  [facility]  Office  [phone]  CC: Primary care physician; Barbette Reichmann, MD

## 2015-04-19 LAB — CULTURE, BLOOD (ROUTINE X 2)
CULTURE: NO GROWTH
CULTURE: NO GROWTH

## 2015-04-22 ENCOUNTER — Encounter: Payer: Self-pay | Admitting: Emergency Medicine

## 2015-04-22 ENCOUNTER — Emergency Department: Payer: Medicare Other

## 2015-04-22 ENCOUNTER — Inpatient Hospital Stay
Admission: EM | Admit: 2015-04-22 | Discharge: 2015-04-25 | DRG: 291 | Disposition: A | Discharge status: Home Health | Payer: Medicare Other | Attending: Internal Medicine | Admitting: Internal Medicine

## 2015-04-22 DIAGNOSIS — I130 Hypertensive heart and chronic kidney disease with heart failure and stage 1 through stage 4 chronic kidney disease, or unspecified chronic kidney disease: Secondary | ICD-10-CM | POA: Diagnosis not present

## 2015-04-22 DIAGNOSIS — E871 Hypo-osmolality and hyponatremia: Secondary | ICD-10-CM | POA: Diagnosis present

## 2015-04-22 DIAGNOSIS — I5023 Acute on chronic systolic (congestive) heart failure: Secondary | ICD-10-CM

## 2015-04-22 DIAGNOSIS — J449 Chronic obstructive pulmonary disease, unspecified: Secondary | ICD-10-CM | POA: Diagnosis present

## 2015-04-22 DIAGNOSIS — I5021 Acute systolic (congestive) heart failure: Secondary | ICD-10-CM | POA: Diagnosis not present

## 2015-04-22 DIAGNOSIS — L03115 Cellulitis of right lower limb: Secondary | ICD-10-CM | POA: Diagnosis present

## 2015-04-22 DIAGNOSIS — Z7982 Long term (current) use of aspirin: Secondary | ICD-10-CM | POA: Diagnosis not present

## 2015-04-22 DIAGNOSIS — Z794 Long term (current) use of insulin: Secondary | ICD-10-CM | POA: Diagnosis not present

## 2015-04-22 DIAGNOSIS — L03116 Cellulitis of left lower limb: Secondary | ICD-10-CM | POA: Diagnosis present

## 2015-04-22 DIAGNOSIS — E1165 Type 2 diabetes mellitus with hyperglycemia: Secondary | ICD-10-CM | POA: Diagnosis present

## 2015-04-22 DIAGNOSIS — L03119 Cellulitis of unspecified part of limb: Secondary | ICD-10-CM | POA: Diagnosis present

## 2015-04-22 DIAGNOSIS — N183 Chronic kidney disease, stage 3 (moderate): Secondary | ICD-10-CM | POA: Diagnosis present

## 2015-04-22 DIAGNOSIS — I429 Cardiomyopathy, unspecified: Secondary | ICD-10-CM | POA: Diagnosis present

## 2015-04-22 DIAGNOSIS — I248 Other forms of acute ischemic heart disease: Secondary | ICD-10-CM | POA: Diagnosis present

## 2015-04-22 DIAGNOSIS — I739 Peripheral vascular disease, unspecified: Secondary | ICD-10-CM | POA: Diagnosis present

## 2015-04-22 DIAGNOSIS — R0902 Hypoxemia: Secondary | ICD-10-CM

## 2015-04-22 DIAGNOSIS — Z951 Presence of aortocoronary bypass graft: Secondary | ICD-10-CM

## 2015-04-22 DIAGNOSIS — E876 Hypokalemia: Secondary | ICD-10-CM | POA: Diagnosis present

## 2015-04-22 DIAGNOSIS — E1140 Type 2 diabetes mellitus with diabetic neuropathy, unspecified: Secondary | ICD-10-CM | POA: Diagnosis present

## 2015-04-22 DIAGNOSIS — L8990 Pressure ulcer of unspecified site, unspecified stage: Secondary | ICD-10-CM | POA: Diagnosis present

## 2015-04-22 DIAGNOSIS — I081 Rheumatic disorders of both mitral and tricuspid valves: Secondary | ICD-10-CM | POA: Diagnosis present

## 2015-04-22 DIAGNOSIS — I2510 Atherosclerotic heart disease of native coronary artery without angina pectoris: Secondary | ICD-10-CM | POA: Diagnosis present

## 2015-04-22 DIAGNOSIS — I509 Heart failure, unspecified: Secondary | ICD-10-CM

## 2015-04-22 LAB — CBC
HCT: 36.7 % — ABNORMAL LOW (ref 40.0–52.0)
Hemoglobin: 12.3 g/dL — ABNORMAL LOW (ref 13.0–18.0)
MCH: 28.3 pg (ref 26.0–34.0)
MCHC: 33.3 g/dL (ref 32.0–36.0)
MCV: 85 fL (ref 80.0–100.0)
Platelets: 175 10*3/uL (ref 150–440)
RBC: 4.32 MIL/uL — ABNORMAL LOW (ref 4.40–5.90)
RDW: 16.2 % — ABNORMAL HIGH (ref 11.5–14.5)
WBC: 6.4 10*3/uL (ref 3.8–10.6)

## 2015-04-22 LAB — BASIC METABOLIC PANEL
ANION GAP: 9 (ref 5–15)
BUN: 39 mg/dL — AB (ref 6–20)
CALCIUM: 8.6 mg/dL — AB (ref 8.9–10.3)
CO2: 30 mmol/L (ref 22–32)
Chloride: 91 mmol/L — ABNORMAL LOW (ref 101–111)
Creatinine, Ser: 1.66 mg/dL — ABNORMAL HIGH (ref 0.61–1.24)
GFR calc Af Amer: 42 mL/min — ABNORMAL LOW (ref >60)
GFR, EST NON AFRICAN AMERICAN: 37 mL/min — AB (ref >60)
GLUCOSE: 200 mg/dL — AB (ref 65–99)
Potassium: 3.9 mmol/L (ref 3.5–5.1)
Sodium: 130 mmol/L — ABNORMAL LOW (ref 135–145)

## 2015-04-22 LAB — TROPONIN I: Troponin I: 0.04 ng/mL — ABNORMAL HIGH (ref <0.031)

## 2015-04-22 LAB — GLUCOSE, CAPILLARY: GLUCOSE-CAPILLARY: 156 mg/dL — AB (ref 65–99)

## 2015-04-22 LAB — BRAIN NATRIURETIC PEPTIDE: B Natriuretic Peptide: 2038 pg/mL — ABNORMAL HIGH (ref 0.0–100.0)

## 2015-04-22 MED ORDER — ALBUTEROL SULFATE (2.5 MG/3ML) 0.083% IN NEBU
2.5000 mg | INHALATION_SOLUTION | RESPIRATORY_TRACT | Status: DC | PRN
Start: 2015-04-22 — End: 2015-04-25

## 2015-04-22 MED ORDER — ENOXAPARIN SODIUM 40 MG/0.4ML ~~LOC~~ SOLN
40.0000 mg | SUBCUTANEOUS | Status: DC
  Administered 2015-04-22 – 2015-04-24 (×3): 40 mg via SUBCUTANEOUS
  Filled 2015-04-22 (×3): qty 0.4

## 2015-04-22 MED ORDER — INSULIN ASPART PROT & ASPART (70-30 MIX) 100 UNIT/ML ~~LOC~~ SUSP
25.0000 [IU] | Freq: Two times a day (BID) | SUBCUTANEOUS | Status: DC
  Administered 2015-04-23: 25 [IU] via SUBCUTANEOUS
  Administered 2015-04-24: 10 [IU] via SUBCUTANEOUS
  Filled 2015-04-22 (×2): qty 25

## 2015-04-22 MED ORDER — CEPHALEXIN 500 MG PO CAPS
500.0000 mg | ORAL_CAPSULE | Freq: Three times a day (TID) | ORAL | Status: DC
  Administered 2015-04-22 – 2015-04-24 (×5): 500 mg via ORAL
  Filled 2015-04-22 (×5): qty 1

## 2015-04-22 MED ORDER — ONDANSETRON HCL 4 MG/2ML IJ SOLN: 4.0000 mg | Freq: Four times a day (QID) | INTRAMUSCULAR | Status: DC | PRN

## 2015-04-22 MED ORDER — FUROSEMIDE 10 MG/ML IJ SOLN
60.0000 mg | Freq: Once | INTRAMUSCULAR | Status: AC
  Administered 2015-04-22: 60 mg via INTRAVENOUS
  Filled 2015-04-22: qty 8

## 2015-04-22 MED ORDER — MOMETASONE FURO-FORMOTEROL FUM 100-5 MCG/ACT IN AERO
2.0000 | INHALATION_SPRAY | Freq: Two times a day (BID) | RESPIRATORY_TRACT | Status: DC
  Administered 2015-04-22 – 2015-04-25 (×6): 2 via RESPIRATORY_TRACT
  Filled 2015-04-22 (×2): qty 8.8

## 2015-04-22 MED ORDER — CARVEDILOL 6.25 MG PO TABS
6.2500 mg | ORAL_TABLET | Freq: Two times a day (BID) | ORAL | Status: DC
  Administered 2015-04-23 – 2015-04-25 (×5): 6.25 mg via ORAL
  Filled 2015-04-22 (×6): qty 1

## 2015-04-22 MED ORDER — SODIUM CHLORIDE 0.9 % IV SOLN: 250.0000 mL | INTRAVENOUS | Status: DC | PRN

## 2015-04-22 MED ORDER — ASPIRIN EC 81 MG PO TBEC
81.0000 mg | DELAYED_RELEASE_TABLET | Freq: Every day | ORAL | Status: DC
  Administered 2015-04-23 – 2015-04-25 (×3): 81 mg via ORAL
  Filled 2015-04-22 (×4): qty 1

## 2015-04-22 MED ORDER — ENALAPRIL MALEATE 5 MG PO TABS
2.5000 mg | ORAL_TABLET | Freq: Two times a day (BID) | ORAL | Status: DC
  Administered 2015-04-23 – 2015-04-25 (×4): 2.5 mg via ORAL
  Filled 2015-04-22 (×6): qty 1

## 2015-04-22 MED ORDER — VITAMIN D 1000 UNITS PO TABS
2000.0000 [IU] | ORAL_TABLET | Freq: Every day | ORAL | Status: DC
  Administered 2015-04-23 – 2015-04-25 (×3): 2000 [IU] via ORAL
  Filled 2015-04-22 (×4): qty 2

## 2015-04-22 MED ORDER — SODIUM CHLORIDE 0.9 % IJ SOLN: 3.0000 mL | INTRAMUSCULAR | Status: DC | PRN

## 2015-04-22 MED ORDER — FUROSEMIDE 10 MG/ML IJ SOLN
20.0000 mg | Freq: Two times a day (BID) | INTRAMUSCULAR | Status: DC
  Administered 2015-04-22 – 2015-04-25 (×6): 20 mg via INTRAVENOUS
  Filled 2015-04-22 (×6): qty 2

## 2015-04-22 MED ORDER — SODIUM CHLORIDE 0.9 % IJ SOLN
3.0000 mL | Freq: Two times a day (BID) | INTRAMUSCULAR | Status: DC
  Administered 2015-04-22 – 2015-04-25 (×6): 3 mL via INTRAVENOUS

## 2015-04-22 MED ORDER — POTASSIUM CHLORIDE CRYS ER 20 MEQ PO TBCR
20.0000 meq | EXTENDED_RELEASE_TABLET | Freq: Every day | ORAL | Status: DC
  Administered 2015-04-22 – 2015-04-23 (×2): 20 meq via ORAL
  Filled 2015-04-22 (×2): qty 1

## 2015-04-22 MED ORDER — ACETAMINOPHEN 325 MG PO TABS: 650.0000 mg | ORAL_TABLET | ORAL | Status: DC | PRN

## 2015-04-22 MED ORDER — OCUVITE-LUTEIN PO CAPS
1.0000 | ORAL_CAPSULE | Freq: Two times a day (BID) | ORAL | Status: DC
  Administered 2015-04-22 – 2015-04-25 (×6): 1 via ORAL
  Filled 2015-04-22 (×6): qty 1

## 2015-04-22 NOTE — Progress Notes (Signed)
ANTICOAGULATION CONSULT NOTE - Initial Consult  Pharmacy Consult for Lovenox  Indication: VTE prophylaxis  No Known Allergies  Patient Measurements: Height: 6' (182.9 cm) Weight: 155 lb (70.308 kg) IBW/kg (Calculated) : 77.6 Heparin Dosing Weight:   Vital Signs: Temp: 98.2 F (36.8 C) (12/26 2032) Temp Source: Oral (12/26 2032) BP: 144/79 mmHg (12/26 2032) Pulse Rate: 85 (12/26 2032)  Labs:  Recent Labs  04/22/15 1434  HGB 12.3*  HCT 36.7*  PLT 175  CREATININE 1.66*  TROPONINI 0.04*    Estimated Creatinine Clearance: 33.5 mL/min (by C-G formula based on Cr of 1.66).   Medical History: Past Medical History  Diagnosis Date  . COPD (chronic obstructive pulmonary disease) (HCC)   . Hypertension   . DM type 2 (diabetes mellitus, type 2) (HCC)   . CKD (chronic kidney disease)   . CAD (coronary artery disease)   . Diabetes mellitus with neuropathy (HCC)     Medications:  Prescriptions prior to admission  Medication Sig Dispense Refill Last Dose  . albuterol (PROVENTIL HFA;VENTOLIN HFA) 108 (90 BASE) MCG/ACT inhaler Inhale 2 puffs into the lungs every 4 (four) hours as needed for wheezing or shortness of breath. 1 Inhaler 1 04/22/2015 at Unknown time  . aspirin EC 81 MG tablet Take 81 mg by mouth daily.   04/22/2015 at 0700  . carvedilol (COREG) 6.25 MG tablet Take 6.25 mg by mouth 2 (two) times daily with a meal.   04/22/2015 at 0700  . cephALEXin (KEFLEX) 500 MG capsule Take 1 capsule (500 mg total) by mouth 3 (three) times daily. 15 capsule 0 04/22/2015 at Unknown time  . Cholecalciferol (VITAMIN D3) 2000 UNITS capsule Take 2,000 Units by mouth daily.   04/22/2015 at Unknown time  . Fluticasone-Salmeterol (ADVAIR) 250-50 MCG/DOSE AEPB Inhale 1 puff into the lungs 2 (two) times daily.   04/22/2015 at Unknown time  . furosemide (LASIX) 20 MG tablet Take 20 mg by mouth daily.   04/22/2015 at Unknown time  . glimepiride (AMARYL) 4 MG tablet Take 4 mg by mouth 2 (two)  times daily.   04/22/2015 at Unknown time  . HUMULIN 70/30 (70-30) 100 UNIT/ML injection Inject 25 Units into the skin 2 (two) times daily before a meal. 10 mL 5 04/22/2015 at Unknown time  . Multiple Vitamins-Minerals (PRESERVISION AREDS 2 PO) Take 1 capsule by mouth 2 (two) times daily.    04/22/2015 at Unknown time  . vitamin E (VITAMIN E) 1000 UNIT capsule Take 1,000 Units by mouth daily.   04/22/2015 at Unknown time    Assessment: CrCl = 33.5 ml/min TBW = 70.3   Goal of Therapy:  DVT prophylaxis   Plan:  Lovenox 30 mg SQ Q24H originally ordered.   Will adjust dose to lovenox 40 mg SQ Q24H.   Tanja Gift D 04/22/2015,8:45 PM

## 2015-04-22 NOTE — ED Provider Notes (Signed)
[facility] Emergency Department Provider Note  ____________________________________________   I have reviewed the triage vital signs and the nursing notes.   HISTORY  Chief Complaint Shortness of Breath    HPI Kenneth Massey is a 81 y.o. male who has AF of 40-45% was recently discharged from this hospital after a admission for CHF. Patient was to be sent home on 29 g of Lasix but due to a miscarriage medication patient did not take the Lasix and subsequent has had increasing bilateral lower extremity pedal edema and dyspnea at rest and exertion. He denies chest pain at this time although he states "I can get it". He denies any nausea or vomiting. Patient cautioned saturation was 89 on room air when I entered the room. With deep breaths it went back up to 93  Past Medical History  Diagnosis Date  . COPD (chronic obstructive pulmonary disease) (HCC)   . Hypertension   . DM type 2 (diabetes mellitus, type 2) (HCC)   . CKD (chronic kidney disease)   . CAD (coronary artery disease)   . Diabetes mellitus with neuropathy [facility](HCC)     Patient Active Problem List   Diagnosis Date Noted  . CHF (congestive heart failure) (HCC) 04/13/2015    Past Surgical History  Procedure Laterality Date  . Coronary artery bypass graft    . Cholecystectomy    . Abdominal aortic aneurysm repair    . Renal artery stent      Current Outpatient Rx  Name  Route  Sig  Dispense  Refill  . albuterol (PROVENTIL HFA;VENTOLIN HFA) 108 (90 BASE) MCG/ACT inhaler   Inhalation   Inhale 2 puffs into the lungs every 4 (four) hours as needed for wheezing or shortness of breath.   1 Inhaler   1   . aspirin EC 81 MG tablet   Oral   Take 81 mg by mouth daily.         . carvedilol (COREG) 6.25 MG tablet   Oral   Take 6.25 mg by mouth 2 (two) times daily with a meal.         . cephALEXin (KEFLEX) 500 MG capsule   Oral   Take 1 capsule (500 mg total) by mouth 3 (three) times  daily.   15 capsule   0   . Cholecalciferol (VITAMIN D3) 2000 UNITS capsule   Oral   Take 2,000 Units by mouth daily.         . Fluticasone-Salmeterol (ADVAIR) 250-50 MCG/DOSE AEPB   Inhalation   Inhale 1 puff into the lungs 2 (two) times daily.         . furosemide (LASIX) 20 MG tablet   Oral   Take 1 tablet (20 mg total) by mouth daily. For fluid.   30 tablet   0   . HUMULIN 70/30 (70-30) 100 UNIT/ML injection   Subcutaneous   Inject 25 Units into the skin 2 (two) times daily before a meal.   10 mL   5     Dispense as written.   . Multiple Vitamins-Minerals (PRESERVISION AREDS 2 PO)   Oral   Take 1 capsule by mouth 2 (two) times daily.          . vitamin E (VITAMIN E) 1000 UNIT capsule   Oral   Take 1,000 Units by mouth daily.           Allergies Review of patient's allergies indicates no known allergies.  Family History  Problem  Relation Age of Onset  . Hypertension      Social History Social History  Substance Use Topics  . Smoking status: Never Smoker   . Smokeless tobacco: None  . Alcohol Use: No    Review of Systems Constitutional: No fever/chills Eyes: No visual changes. ENT: No sore throat. No stiff neck no neck pain Cardiovascular: Positive chest pain. Respiratory: See history of present illness regarding shortness of breath. Gastrointestinal:   no vomiting.  No diarrhea.  No constipation. Genitourinary: Negative for dysuria. Musculoskeletal: Negative lower extremity swelling Skin: Negative for rash. Neurological: Negative for headaches, focal weakness or numbness. 10-point ROS otherwise negative.  ____________________________________________   PHYSICAL EXAM:  VITAL SIGNS: ED Triage Vitals  Enc Vitals Group     BP 04/22/15 1417 133/68 mmHg     Pulse Rate 04/22/15 1417 74     Resp 04/22/15 1417 24     Temp 04/22/15 1417 97.8 F (36.6 C)     Temp Source 04/22/15 1417 Oral     SpO2 04/22/15 1417 93 %     Weight 04/22/15  1417 155 lb (70.308 kg)     Height 04/22/15 1417 6' (1.829 m)     Head Cir --      Peak Flow --      Pain Score 04/22/15 1430 0     Pain Loc --      Pain Edu? --      Excl. in GC? --     Constitutional: Alert and oriented. Well appearing and in no acute distress. Eyes: Conjunctivae are normal. PERRL. EOMI. Head: Atraumatic. Nose: No congestion/rhinnorhea. Mouth/Throat: Mucous membranes are moist.  Oropharynx non-erythematous. Neck: No stridor.   Nontender with no meningismus Cardiovascular: Normal rate, regular rhythm. Grossly normal heart sounds.  Good peripheral circulation. Respiratory: Normal respiratory effort.  No retractions. The latest in the bases with bibasilar rails Abdominal: Soft and nontender. No distention. No guarding no rebound Back:  There is no focal tenderness or step off there is no midline tenderness there are no lesions noted. there is no CVA tenderness Musculoskeletal: No lower extremity tenderness. No joint effusions, no DVT signs strong distal pulses 2-3+ bilateral symmetric pitting edema Neurologic:  Normal speech and language. No gross focal neurologic deficits are appreciated.  Skin:  Skin is warm, dry and intact. No rash noted. Psychiatric: Mood and affect are normal. Speech and behavior are normal.  ____________________________________________   LABS (all labs ordered are listed, but only abnormal results are displayed)  Labs Reviewed  BASIC METABOLIC PANEL - Abnormal; Notable for the following:    Sodium 130 (*)    Chloride 91 (*)    Glucose, Bld 200 (*)    BUN 39 (*)    Creatinine, Ser 1.66 (*)    Calcium 8.6 (*)    GFR calc non Af Amer 37 (*)    GFR calc Af Amer 42 (*)    All other components within normal limits  CBC - Abnormal; Notable for the following:    RBC 4.32 (*)    Hemoglobin 12.3 (*)    HCT 36.7 (*)    RDW 16.2 (*)    All other components within normal limits  BRAIN NATRIURETIC PEPTIDE - Abnormal; Notable for the following:     B Natriuretic Peptide 2038.0 (*)    All other components within normal limits  TROPONIN I - Abnormal; Notable for the following:    Troponin I 0.04 (*)    All other components within  normal limits   ____________________________________________  EKG  I personally interpreted any EKGs ordered by me or triage Normal sinus rhythm left axis deviation rate 70 bpm nonspecific interventricular conduction delay no acute ST elevation, lateral T-wave changes noted no acute ST elevation no acute ST depression ____________________________________________  RADIOLOGY  I reviewed any imaging ordered by me or triage that were performed during my shift ____________________________________________   PROCEDURES  Procedure(s) performed: None  Critical Care performed: CRITICAL CARE Performed by: Jeanmarie Plant   Total critical care time: 35 minutes  Critical care time was exclusive of separately billable procedures and treating other patients.  Critical care was necessary to treat or prevent imminent or life-threatening deterioration.  Critical care was time spent personally by me on the following activities: development of treatment plan with patient and/or surrogate as well as nursing, discussions with consultants, evaluation of patient's response to treatment, examination of patient, obtaining history from patient or surrogate, ordering and performing treatments and interventions, ordering and review of laboratory studies, ordering and review of radiographic studies, pulse oximetry and re-evaluation of patient's condition.   ____________________________________________   INITIAL IMPRESSION / ASSESSMENT AND PLAN / ED COURSE  Pertinent labs & imaging results that were available during my care of the patient were reviewed by me and considered in my medical decision making (see chart for details).  Patient with CHF and hypoxia  at rest, BNP is markedly elevated from last checked, due to  some confusion about medications he did not take his Lasix. We will give him Lasix here he will need to be admitted for diuresis again. ____________________________________________   FINAL CLINICAL IMPRESSION(S) / ED DIAGNOSES  Final diagnoses:  None     Jeanmarie Plant, MD 04/22/15 [phone]

## 2015-04-22 NOTE — ED Notes (Signed)
Pt O2 sats 89%, placed on 2L nasal canula

## 2015-04-22 NOTE — ED Notes (Signed)
100 cc urine out

## 2015-04-22 NOTE — H&P (Signed)
[facility] - [place] at [facility]   PATIENT NAME: Kenneth Massey    MR#:  [id number]  DATE OF BIRTH:  Sep 05, 1929  DATE OF ADMISSION:  04/22/2015  PRIMARY CARE PHYSICIAN: Barbette Reichmann, MD   REQUESTING/REFERRING PHYSICIAN: Dr. York Cerise  CHIEF COMPLAINT:  Shortness of breath and pedal edema  HISTORY OF PRESENT ILLNESS:  Kenneth Massey  is a 81 y.o. male with a known history of COPD, hypertension, diabetes mellitus, chronic kidney disease, coronary artery disease and congestive heart failure was just recently admitted to the hospital for acute CHF and discharged on 04/16/2015. Patient was discharged home on by mouth Lasix but he got mixed up and stopped  taking his diuretics and presenting today with worsening of shortness of breath and lower extent is swelling. Denies any chest pain, dizziness or loss of consciousness. Daughter is at bedside, no other complaints  PAST MEDICAL HISTORY:   Past Medical History  Diagnosis Date  . COPD (chronic obstructive pulmonary disease) (HCC)   . Hypertension   . DM type 2 (diabetes mellitus, type 2) (HCC)   . CKD (chronic kidney disease)   . CAD (coronary artery disease)   . Diabetes mellitus with neuropathy (HCC)     PAST SURGICAL HISTOIRY:   Past Surgical History  Procedure Laterality Date  . Coronary artery bypass graft    . Cholecystectomy    . Abdominal aortic aneurysm repair    . Renal artery stent      SOCIAL HISTORY:   Social History  Substance Use Topics  . Smoking status: Never Smoker   . Smokeless tobacco: Not on file  . Alcohol Use: No    FAMILY HISTORY:   Family History  Problem Relation Age of Onset  . Hypertension      DRUG ALLERGIES:  No Known Allergies  REVIEW OF SYSTEMS:  CONSTITUTIONAL: No fever, fatigue or weakness.  EYES: No blurred or double vision.  EARS, NOSE, AND THROAT: No tinnitus or ear pain.  RESPIRATORY: No cough, reports shortness of breath, denies wheezing  or hemoptysis.  CARDIOVASCULAR: No chest pain, orthopnea, edema.  GASTROINTESTINAL: No nausea, vomiting, diarrhea or abdominal pain.  GENITOURINARY: No dysuria, hematuria.  ENDOCRINE: No polyuria, nocturia,  HEMATOLOGY: No anemia, easy bruising or bleeding SKIN: Reporting  pedal edema No rash or lesion. MUSCULOSKELETAL: No joint pain or arthritis.   NEUROLOGIC: No tingling, numbness, weakness.  PSYCHIATRY: No anxiety or depression.   MEDICATIONS AT HOME:   Prior to Admission medications   Medication Sig Start Date End Date Taking? Authorizing Provider  albuterol (PROVENTIL HFA;VENTOLIN HFA) 108 (90 BASE) MCG/ACT inhaler Inhale 2 puffs into the lungs every 4 (four) hours as needed for wheezing or shortness of breath. 09/24/14  Yes Hassan Rowan, MD  aspirin EC 81 MG tablet Take 81 mg by mouth daily.   Yes Historical Provider, MD  carvedilol (COREG) 6.25 MG tablet Take 6.25 mg by mouth 2 (two) times daily with a meal.   Yes Historical Provider, MD  cephALEXin (KEFLEX) 500 MG capsule Take 1 capsule (500 mg total) by mouth 3 (three) times daily. 04/16/15  Yes Gale Journey, MD  Cholecalciferol (VITAMIN D3) 2000 UNITS capsule Take 2,000 Units by mouth daily.   Yes Historical Provider, MD  Fluticasone-Salmeterol (ADVAIR) 250-50 MCG/DOSE AEPB Inhale 1 puff into the lungs 2 (two) times daily.   Yes Historical Provider, MD  furosemide (LASIX) 20 MG tablet Take 20 mg by mouth daily.   Yes Historical Provider, MD  glimepiride (  AMARYL) 4 MG tablet Take 4 mg by mouth 2 (two) times daily.   Yes Historical Provider, MD  HUMULIN 70/30 (70-30) 100 UNIT/ML injection Inject 25 Units into the skin 2 (two) times daily before a meal. 04/16/15  Yes Gale Journeyatherine P Walsh, MD  Multiple Vitamins-Minerals (PRESERVISION AREDS 2 PO) Take 1 capsule by mouth 2 (two) times daily.    Yes Historical Provider, MD  vitamin E (VITAMIN E) 1000 UNIT capsule Take 1,000 Units by mouth daily.   Yes Historical Provider, MD       VITAL SIGNS:  Blood pressure 130/71, pulse 79, temperature 97.8 F (36.6 C), temperature source Oral, resp. rate 22, height 6' (1.829 m), weight 70.308 kg (155 lb), SpO2 100 %.  PHYSICAL EXAMINATION:  GENERAL:  81 y.o.-year-old patient lying in the bed with no acute distress.  EYES: Pupils equal, round, reactive to light and accommodation. No scleral icterus. Extraocular muscles intact.  HEENT: Head atraumatic, normocephalic. Oropharynx and nasopharynx clear.  NECK:  Supple, no jugular venous distention. No thyroid enlargement, no tenderness.  LUNGS: Moderate breath sounds bilaterally, diminished at the bases with rales and rhonchi, no wheezing. No use of accessory muscles of respiration.  CARDIOVASCULAR: S1, S2 normal. No murmurs, rubs, or gallops.  ABDOMEN: Soft, nontender, nondistended. Bowel sounds present. No organomegaly or mass.  EXTREMITIES: No pedal edema, cyanosis, or clubbing.  NEUROLOGIC: Cranial nerves II through XII are intact. Muscle strength 5/5 in all extremities. Sensation intact. Gait not checked.  PSYCHIATRIC: The patient is alert and oriented x 3.  SKIN: No obvious rash, lesion, or ulcer.   LABORATORY PANEL:   CBC  Recent Labs Lab 04/22/15 1434  WBC 6.4  HGB 12.3*  HCT 36.7*  PLT 175   ------------------------------------------------------------------------------------------------------------------  Chemistries   Recent Labs Lab 04/22/15 1434  NA 130*  K 3.9  CL 91*  CO2 30  GLUCOSE 200*  BUN 39*  CREATININE 1.66*  CALCIUM 8.6*   ------------------------------------------------------------------------------------------------------------------  Cardiac Enzymes  Recent Labs Lab 04/22/15 1434  TROPONINI 0.04*   ------------------------------------------------------------------------------------------------------------------  RADIOLOGY:  Dg Chest 2 View  04/22/2015  CLINICAL DATA:  Increased short of breath 2 days. EXAM: CHEST  2 VIEW  COMPARISON:  Radiograph 04/13/2015 FINDINGS: Normal cardiac silhouette. The bilateral pleural effusions slightly increased compared to prior. Chronic bronchitic markings are noted. Mild interstitial edema similar. No pneumothorax. IMPRESSION: 1. Increasing bilateral pleural effusions. 2. Stable interstitial edema. Electronically Signed   By: Genevive BiStewart  Edmunds M.D.   On: 04/22/2015 16:18    EKG:   Orders placed or performed during the hospital encounter of 04/22/15  . EKG 12-Lead  . EKG 12-Lead  . ED EKG  . ED EKG    IMPRESSION AND PLAN:   1. Acute congestive heart failure with recent echocardiogram during previous admission has revealed 45-50% ejection fraction Patient was confused and stopped taking Lasix Will provide Lasix 20 g IV every 12 hours with potassium supplements. Continue aspirin, Coreg and add small dose ACE inhibitor Consult is placed to Dr. Darrold JunkerParaschos Monitor daily weights and intake and output  2. Hyponatremia-probably dilutional from fluid overload  Patient  is on Lasix, monitor a.m. labs   3. Diabetes Resume his home dose insulin Humulin 70/30 25 units twice a day and monitor sugars closely   provide sliding scale insulin for hyperglycemia   4. Chronic history of COPD = no exacerbation   continue Advair   5. Essential hypertension -blood pressure is elevated at this time   resume Coreg. patient  is on Lasix IV Small dose Vasotec is added to the regimen  6. Bilateral lower extending to cellulitis, diagnosed during previous admission Clinically improving Continue Keflex which was prescribed during the previous hospital admission until  course is done   Provide GI and DVT prophylaxis  All the records are reviewed and case discussed with ED provider. Management plans discussed with the patient, family and they are in agreement.  CODE STATUS: Full code, wife is the healthcare power of attorney  TOTAL TIME TAKING CARE OF THIS PATIENT: .    Ramonita Lab M.D on 04/22/2015 at 6:53 PM  Between 7am to 6pm - Pager - [phone]  After 6pm go to [email or web address] - password [email or web address] [facility]  [place] [facility]  Office  [phone]  CC: Primary care physician; Barbette Reichmann, MD

## 2015-04-22 NOTE — ED Notes (Signed)
Pt to X-Ray. In no acute distress at this time.

## 2015-04-22 NOTE — ED Notes (Signed)
Troponin critical lab value 0.04, MD made aware.

## 2015-04-22 NOTE — ED Notes (Signed)
Patient presents to the ED with increased shortness of breath x 2 days.  Patient's feet and legs are swollen more than normal.  Patient reports history of "fluid on my heart".  Patient is in no obvious distress at this time.  Patient is speaking in full sentences.  Patient appears pale.  Breath sounds are diminished.

## 2015-04-23 DIAGNOSIS — L8990 Pressure ulcer of unspecified site, unspecified stage: Secondary | ICD-10-CM | POA: Insufficient documentation

## 2015-04-23 LAB — GLUCOSE, CAPILLARY
GLUCOSE-CAPILLARY: 99 mg/dL (ref 65–99)
GLUCOSE-CAPILLARY: 64 mg/dL — AB (ref 65–99)
GLUCOSE-CAPILLARY: 143 mg/dL — AB (ref 65–99)
Glucose-Capillary: 205 mg/dL — ABNORMAL HIGH (ref 65–99)
Glucose-Capillary: 114 mg/dL — ABNORMAL HIGH (ref 65–99)
Glucose-Capillary: 64 mg/dL — ABNORMAL LOW (ref 65–99)
Glucose-Capillary: 66 mg/dL (ref 65–99)
Glucose-Capillary: 253 mg/dL — ABNORMAL HIGH (ref 65–99)

## 2015-04-23 LAB — BASIC METABOLIC PANEL
ANION GAP: 8 (ref 5–15)
BUN: 35 mg/dL — ABNORMAL HIGH (ref 6–20)
CALCIUM: 8.5 mg/dL — AB (ref 8.9–10.3)
CO2: 33 mmol/L — ABNORMAL HIGH (ref 22–32)
Chloride: 92 mmol/L — ABNORMAL LOW (ref 101–111)
Creatinine, Ser: 1.57 mg/dL — ABNORMAL HIGH (ref 0.61–1.24)
GFR, EST AFRICAN AMERICAN: 45 mL/min — AB (ref >60)
GFR, EST NON AFRICAN AMERICAN: 39 mL/min — AB (ref >60)
GLUCOSE: 209 mg/dL — AB (ref 65–99)
Potassium: 3.2 mmol/L — ABNORMAL LOW (ref 3.5–5.1)
Sodium: 133 mmol/L — ABNORMAL LOW (ref 135–145)

## 2015-04-23 MED ORDER — POTASSIUM CHLORIDE CRYS ER 20 MEQ PO TBCR
20.0000 meq | EXTENDED_RELEASE_TABLET | Freq: Two times a day (BID) | ORAL | Status: DC
  Administered 2015-04-23 – 2015-04-25 (×4): 20 meq via ORAL
  Filled 2015-04-23 (×4): qty 1

## 2015-04-23 NOTE — Plan of Care (Signed)
Problem: Education: Goal: Ability to demonstrate managment of disease process will improve Outcome: Not Met (add Reason) Patient stopped taking diuretic.

## 2015-04-23 NOTE — Progress Notes (Signed)
[facility] - [place] at [facility]   PATIENT NAME: Kenneth Massey    MR#:  [id number]  DATE OF BIRTH:  08/08/1929  SUBJECTIVE:    Patient is doing well this morning. Patient reports shortness of breath is improved. Patient continues to have mild lower extremity edema. Wife is at bedside.  REVIEW OF SYSTEMS:    Review of Systems  Constitutional: Negative for fever, chills and malaise/fatigue.  HENT: Negative for sore throat.   Eyes: Negative for blurred vision.  Respiratory: Negative for cough, hemoptysis, shortness of breath and wheezing.   Cardiovascular: Negative for chest pain, palpitations and leg swelling.  Gastrointestinal: Negative for nausea, vomiting, abdominal pain, diarrhea and blood in stool.  Genitourinary: Negative for dysuria.  Musculoskeletal: Negative for back pain.  Neurological: Negative for dizziness, tremors and headaches.  Endo/Heme/Allergies: Does not bruise/bleed easily.    Tolerating Diet: Yes      DRUG ALLERGIES:  No Known Allergies  VITALS:  Blood pressure 110/50, pulse 78, temperature 97.9 F (36.6 C), temperature source Oral, resp. rate 18, height 6' (1.829 m), weight 72.802 kg (160 lb 8 oz), SpO2 98 %.  PHYSICAL EXAMINATION:   Physical Exam  Cardiovascular:  Murmur heard. Musculoskeletal: He exhibits edema.  Skin:  SCALY lower extremity with chronic skin changes some mild erythema      LABORATORY PANEL:   CBC  Recent Labs Lab 04/22/15 1434  WBC 6.4  HGB 12.3*  HCT 36.7*  PLT 175   ------------------------------------------------------------------------------------------------------------------  Chemistries   Recent Labs Lab 04/23/15 0559  NA 133*  K 3.2*  CL 92*  CO2 33*  GLUCOSE 209*  BUN 35*  CREATININE 1.57*  CALCIUM 8.5*   ------------------------------------------------------------------------------------------------------------------  Cardiac Enzymes  Recent Labs Lab  04/22/15 1434  TROPONINI 0.04*   ------------------------------------------------------------------------------------------------------------------  RADIOLOGY:  Dg Chest 2 View  04/22/2015  CLINICAL DATA:  Increased short of breath 2 days. EXAM: CHEST  2 VIEW COMPARISON:  Radiograph 04/13/2015 FINDINGS: Normal cardiac silhouette. The bilateral pleural effusions slightly increased compared to prior. Chronic bronchitic markings are noted. Mild interstitial edema similar. No pneumothorax. IMPRESSION: 1. Increasing bilateral pleural effusions. 2. Stable interstitial edema. Electronically Signed   By: Genevive Bi M.D.   On: 04/22/2015 16:18     ASSESSMENT AND PLAN:   81 year old male with a history of systolic heart failure EF of 45-50% who was recently discharged from the hospital with CHF who presents again with acute systolic on chronic systolic heart failure.  1. Acute systolic heart failure: Recent echocardiogram shows ejection fraction of 45-50%. Apparently patient was confused and stopped taking Lasix. Continue Lasix 20 mg his IV every 12 hours with potassium supplementation. Continue aspirin, Coreg and low-dose ACE inhibitor. Continue to monitor intakeand output.  2. Lower external he cellulitis: Patient was being treated with Keflex as an outpatient for cellulitis. I will continue this for 1 more day.  3. Hypokalemia: Continue potassium supplementation.   4. Type 2 diabetes with chronic kidney disease stage III: Continue 70/30. Continue Accu-Cheks and sliding scale insulin.    5. Essential hypertension: Continue Coreg and Vasotec.    Management plans discussed with the patient and he  is in agreement CODE STATUS:  FULL  TOTAL TIME TAKING CARE OF THIS PATIENT: 30 minutes.     POSSIBLE D/C 1-2 days, DEPENDING ON CLINICAL CONDITION.   Kenneth Massey M.D on 04/23/2015 at 12:16 PM  Between 7am to 6pm - Pager - [phone] After 6pm go to [email or web address] - password  [email or web address]  [place]  [facility]  Office  [phone]  CC: Primary care physician; Kenneth ReichmannHANDE,VISHWANATH, MD  Note: This dictation was prepared with Dragon dictation along with smaller phrase technology. Any transcriptional errors that result from this process are unintentional.

## 2015-04-23 NOTE — Care Management (Signed)
Patient admitted with CHF exacerbation.  Patient lives at home with his wife.  States that they have local family for support.  Patient uses [facility] in [place] to obtain his medication.  Patient uses a cane for ambulation, and has a bath seat in the home.  Patient still drives.  Patient was recently discharged with CHF.  Home oxygen was set up through [facility].  Patient states that he was only supposed to be using O2 at night, and has not been using it.  Reviewed chart from previous admission order was documented as need for continuous O2, [facility] also confirms that order was supposed to be used continuously.  Patient was prescribed Lasix at time of discharge, but was not aware that there was a new prescription and did not start it.   Referral was made to [facility] for nursing.  Patient states that they were not contacted by [facility] to initiate services.  Spoke with Barbara CowerJason from [facility].  Barbara CowerJason stated that the patient's phone number was transcribed incorrectly, and they were not able to make contact again.  Home health nursing will be initiated at the time of this discharge.  Patient would like to use [facility] again.  [facility] will notify at time of discharge. RNCM following for discharge planning

## 2015-04-23 NOTE — Consult Note (Signed)
[facility] Consultation Note  Patient ID: Kenneth Massey, MRN: [id number], DOB/AGE: [birth year] 81 y.o. Admit date: 04/22/2015   Date of Consult: 04/23/2015 Primary Physician: Barbette ReichmannHANDE,VISHWANATH, MD Primary Cardiologist: [facility]  Chief Complaint:  Chief Complaint  Patient presents with  . Shortness of Breath   Reason for Consult: acute on chronic heart failure  HPI: 81 y.o. male with known coronary artery disease status post coronary artery bypass graft and mitral valve repair in 2010 with moderate aortic valve stenosis abdominal aortic aneurysm with chronic kidney disease stage III have ringing acute on chronic systolic dysfunction congestive heart failure. The patient has had multiple hospitalizations in recent weeks with this issue using appropriate medication management including ACE inhibitor diuretics and has had recurrent episodes. After further evaluation it does appear that the patient has had a dietary indiscretions since returning to home. This includes a high salt diet which could have caused a lot of his issues in addition to his chronic cardiovascular concerns. There is no evidence of myocardial infarction at this time with elevated troponin of 0.04 more consistent with demand ischemia. The patient does have slight improvements of symptoms with intravenous Lasix today  Past Medical History  Diagnosis Date  . COPD (chronic obstructive pulmonary disease) (HCC)   . Hypertension   . DM type 2 (diabetes mellitus, type 2) (HCC)   . CKD (chronic kidney disease)   . CAD (coronary artery disease)   . Diabetes mellitus with neuropathy [facility](HCC)       Surgical History:  Past Surgical History  Procedure Laterality Date  . Coronary artery bypass graft    . Cholecystectomy    . Abdominal aortic aneurysm repair    . Renal artery stent       Home Meds: Prior to Admission medications   Medication Sig Start Date End Date Taking? Authorizing Provider  albuterol  (PROVENTIL HFA;VENTOLIN HFA) 108 (90 BASE) MCG/ACT inhaler Inhale 2 puffs into the lungs every 4 (four) hours as needed for wheezing or shortness of breath. 09/24/14  Yes Hassan RowanEugene Wade, MD  aspirin EC 81 MG tablet Take 81 mg by mouth daily.   Yes Historical Provider, MD  carvedilol (COREG) 6.25 MG tablet Take 6.25 mg by mouth 2 (two) times daily with a meal.   Yes Historical Provider, MD  cephALEXin (KEFLEX) 500 MG capsule Take 1 capsule (500 mg total) by mouth 3 (three) times daily. 04/16/15  Yes Gale Journeyatherine P Walsh, MD  Cholecalciferol (VITAMIN D3) 2000 UNITS capsule Take 2,000 Units by mouth daily.   Yes Historical Provider, MD  Fluticasone-Salmeterol (ADVAIR) 250-50 MCG/DOSE AEPB Inhale 1 puff into the lungs 2 (two) times daily.   Yes Historical Provider, MD  furosemide (LASIX) 20 MG tablet Take 20 mg by mouth daily.   Yes Historical Provider, MD  glimepiride (AMARYL) 4 MG tablet Take 4 mg by mouth 2 (two) times daily.   Yes Historical Provider, MD  HUMULIN 70/30 (70-30) 100 UNIT/ML injection Inject 25 Units into the skin 2 (two) times daily before a meal. 04/16/15  Yes Gale Journeyatherine P Walsh, MD  Multiple Vitamins-Minerals (PRESERVISION AREDS 2 PO) Take 1 capsule by mouth 2 (two) times daily.    Yes Historical Provider, MD  vitamin E (VITAMIN E) 1000 UNIT capsule Take 1,000 Units by mouth daily.   Yes Historical Provider, MD    Inpatient Medications:  . aspirin EC  81 mg Oral Daily  . carvedilol  6.25 mg Oral BID WC  . cephALEXin  500  mg Oral TID  . cholecalciferol  2,000 Units Oral Daily  . enalapril  2.5 mg Oral BID  . enoxaparin (LOVENOX) injection  40 mg Subcutaneous Q24H  . furosemide  20 mg Intravenous Q12H  . insulin aspart protamine- aspart  25 Units Subcutaneous BID WC  . mometasone-formoterol  2 puff Inhalation BID  . multivitamin-lutein  1 capsule Oral BID  . potassium chloride  20 mEq Oral BID  . sodium chloride  3 mL Intravenous Q12H      Allergies: No Known Allergies  Social  History   Social History  . Marital Status: Married    Spouse Name: N/A  . Number of Children: N/A  . Years of Education: N/A   Occupational History  . Not on file.   Social History Main Topics  . Smoking status: Never Smoker   . Smokeless tobacco: Not on file  . Alcohol Use: No  . Drug Use: No  . Sexual Activity: Not on file   Other Topics Concern  . Not on file   Social History Narrative     Family History  Problem Relation Age of Onset  . Hypertension       Review of Systems Positive for shortness of breath edema Negative for: General:  chills, fever, night sweats or weight changes.  Cardiovascular: PND orthopnea syncope dizziness  Dermatological skin lesions rashes Respiratory: Cough congestion Urologic: Frequent urination urination at night and hematuria Abdominal: negative for nausea, vomiting, diarrhea, bright red blood per rectum, melena, or hematemesis Neurologic: negative for visual changes, and/or hearing changes  All other systems reviewed and are otherwise negative except as noted above.  Labs:  Recent Labs  04/22/15 1434  TROPONINI 0.04*   Lab Results  Component Value Date   WBC 6.4 04/22/2015   HGB 12.3* 04/22/2015   HCT 36.7* 04/22/2015   MCV 85.0 04/22/2015   PLT 175 04/22/2015    Recent Labs Lab 04/23/15 0559  NA 133*  K 3.2*  CL 92*  CO2 33*  BUN 35*  CREATININE 1.57*  CALCIUM 8.5*  GLUCOSE 209*   No results found for: CHOL, HDL, LDLCALC, TRIG No results found for: DDIMER  Radiology/Studies:  Dg Chest 2 View  04/22/2015  CLINICAL DATA:  Increased short of breath 2 days. EXAM: CHEST  2 VIEW COMPARISON:  Radiograph 04/13/2015 FINDINGS: Normal cardiac silhouette. The bilateral pleural effusions slightly increased compared to prior. Chronic bronchitic markings are noted. Mild interstitial edema similar. No pneumothorax. IMPRESSION: 1. Increasing bilateral pleural effusions. 2. Stable interstitial edema. Electronically Signed    By: Genevive Bi M.D.   On: 04/22/2015 16:18   Dg Chest 2 View  04/13/2015  CLINICAL DATA:  Shortness of Breath EXAM: CHEST - 2 VIEW COMPARISON:  09/30/2014 FINDINGS: Cardiac shadow is stable. Postsurgical changes are again noted. Bilateral pleural effusions left greater than right are seen. Mild interstitial changes are noted which may be related to edema. No focal infiltrate is seen. No acute bony abnormality is noted. IMPRESSION: New bilateral pleural effusions left greater than right, changes of mild interstitial edema Electronically Signed   By: Alcide Clever M.D.   On: 04/13/2015 14:32    EKG: Normal sinus rhythm with left axis deviation and anterior myocardial infarctions with nonspecific ST changes  Weights: Filed Weights   04/22/15 1417 04/23/15 0028 04/23/15 0412  Weight: 155 lb (70.308 kg) 164 lb (74.39 kg) 160 lb 8 oz (72.802 kg)     Physical Exam: Blood pressure 110/50, pulse 78,  temperature 97.9 F (36.6 C), temperature source Oral, resp. rate 18, height 6' (1.829 m), weight 160 lb 8 oz (72.802 kg), SpO2 98 %. Body mass index is 21.76 kg/(m^2). General: Well developed, well nourished, in no acute distress. Head eyes ears nose throat: Normocephalic, atraumatic, sclera non-icteric, no xanthomas, nares are without discharge. No apparent thyromegaly and/or mass  Lungs: Normal respiratory effort.  Few wheezes, basilar rales, no rhonchi.  Heart: RRR with normal S1 S2. 2-3+ apical murmur gallop, no rub, PMI is normal size and placement, carotid upstroke normal without bruit, jugular venous pressure is normal Abdomen: Soft, non-tender, non-distended with normoactive bowel sounds. No hepatomegaly. No rebound/guarding. No obvious abdominal masses. Abdominal aorta is normal size   Extremities: 1+ edema. no cyanosis, no clubbing, no ulcers  Peripheral : 2+ bilateral upper extremity pulses, 2+ bilateral femoral pulses, 2+ bilateral dorsal pedal pulse Neuro: Alert and oriented. No facial  asymmetry. No focal deficit. Moves all extremities spontaneously. Musculoskeletal: Normal muscle tone without kyphosis Psych:  Responds to questions appropriately with a normal affect.    Assessment: 81 year old male with coronary artery disease status post coronary bypass graft mitral valve repair moderate aortic valve stenosis with mitral and tricuspid regurgitation and peripheral vascular disease with chronic kidney disease stage III all exacerbating his acute on chronic systolic dysfunction congestive heart failure and major concerns of dietary indiscretion since he was home.  Plan: 1. Continue intravenous Lasix for diuresis 2. Follow closely for worsening chronic kidney disease is a cause of above 3. No further intervention of elevated troponin consistent with demand ischemia 4. Consider beta blocker if patient can tolerate the blood pressure medication management for improved heart failure symptoms 5. Dietary consultation to further reduce the possibility of exacerbation 6. Begin ambulation and follow for improvements of above 7. No further cardiac diagnostics this time  Signed, Lamar Blinks M.D. [facility] Card[facility] 04/23/2015, 1:29 PM

## 2015-04-23 NOTE — Progress Notes (Signed)
Initial Nutrition Assessment    INTERVENTION:   Meals and Snacks: Cater to patient preferences Education: pt verbalizes understanding of current diet order, no questions at this time   NUTRITION DIAGNOSIS:   No nutrition diagnosis at this time  GOAL:   Patient will meet greater than or equal to 90% of their needs  MONITOR:    (Energy Intake, Anthropometrics, Electrolyte/Renal Profile, Digestive System, [place])  REASON FOR ASSESSMENT:   Diagnosis    ASSESSMENT:    Pt admitted with acute CHF, recent ECHO EF 45-50% (pt confused about instructions and had not been taking lasix per MD notes)  Past Medical History  Diagnosis Date  . COPD (chronic obstructive pulmonary disease) (HCC)   . Hypertension   . DM type 2 (diabetes mellitus, type 2) (HCC)   . CKD (chronic kidney disease)   . CAD (coronary artery disease)   . Diabetes mellitus with neuropathy (HCC)      Diet Order:  Diet heart healthy/carb modified Room service appropriate?: Yes; Fluid consistency:: Thin   Energy Intake: pt ate 100% this AM, appetite good  Skin:   (stage I buttock)  Last BM:  12/26   Electrolyte and Renal Profile:  Recent Labs Lab 04/22/15 1434 04/23/15 0559  BUN 39* 35*  CREATININE 1.66* 1.57*  NA 130* 133*  K 3.9 3.2*   Glucose Profile:  Recent Labs  04/22/15 1740 04/23/15 0740 04/23/15 1142  GLUCAP 156* 205* 99   Meds: lasix, MVI, potassium chloride, novolog 70/30  Height:   Ht Readings from Last 1 Encounters:  04/22/15 6' (1.829 m)    Weight:   Wt Readings from Last 1 Encounters:  04/23/15 160 lb 8 oz (72.802 kg)    Filed Weights   04/22/15 1417 04/23/15 0028 04/23/15 0412  Weight: 155 lb (70.308 kg) 164 lb (74.39 kg) 160 lb 8 oz (72.802 kg)    Wt Readings from Last 10 Encounters:  04/23/15 160 lb 8 oz (72.802 kg)  04/16/15 153 lb 12.8 oz (69.763 kg)  03/22/15 156 lb (70.761 kg)  09/30/14 160 lb (72.576 kg)  09/24/14 164 lb (74.39 kg)    BMI:   Body mass index is 21.76 kg/(m^2).  LOW Care Level  Romelle StarcherCate Jahmel Flannagan MS, [place]RD, LDN [phone] Pager  [phone] Weekend/On-Call Pager

## 2015-04-23 NOTE — Progress Notes (Signed)
Inpatient Diabetes Program Recommendations  AACE/ADA: New Consensus Statement on Inpatient Glycemic Control (2015)  Target Ranges:  Prepandial:   less than 140 mg/dL      Peak postprandial:   less than 180 mg/dL (1-2 hours)      Critically ill patients:  140 - 180 mg/dL  Results for [name], Kenneth E (MRN [id number]) as of 04/23/2015 10:05  Ref. Range 04/22/2015 17:40 04/23/2015 07:40  Glucose-Capillary Latest Ref Range: 65-99 mg/dL 119156 (H) 147205 (H)   Review of Glycemic Control  Diabetes history: DM2 Outpatient Diabetes medications: 70/30 25 units BID, Amaryl 4 mg BID Current orders for Inpatient glycemic control: 70/30 25 units BID  Inpatient Diabetes Program Recommendations: Correction (SSI): While inpatient, please consider ordering CBGs with Novolog correction scale ACHS.  Thanks, Orlando PennerMarie Pink Maye, RN, MSN, CDE Diabetes Coordinator Inpatient Diabetes Program [phone] (Team Pager from 8am to 5pm) [phone] ([facility] office) [phone] [facility] office) [phone] [facility] office)

## 2015-04-23 NOTE — Progress Notes (Signed)
Patient currently has an initial appointment scheduled at the [facility] on May 14, 2015 at 9:00am. Thank you.

## 2015-04-24 LAB — BASIC METABOLIC PANEL
ANION GAP: 8 (ref 5–15)
BUN: 37 mg/dL — ABNORMAL HIGH (ref 6–20)
CALCIUM: 8.5 mg/dL — AB (ref 8.9–10.3)
CHLORIDE: 93 mmol/L — AB (ref 101–111)
CO2: 32 mmol/L (ref 22–32)
Creatinine, Ser: 1.69 mg/dL — ABNORMAL HIGH (ref 0.61–1.24)
GFR calc Af Amer: 41 mL/min — ABNORMAL LOW (ref >60)
GFR, EST NON AFRICAN AMERICAN: 36 mL/min — AB (ref >60)
Glucose, Bld: 151 mg/dL — ABNORMAL HIGH (ref 65–99)
POTASSIUM: 4 mmol/L (ref 3.5–5.1)
Sodium: 133 mmol/L — ABNORMAL LOW (ref 135–145)

## 2015-04-24 LAB — CBC
HEMATOCRIT: 36.2 % — AB (ref 40.0–52.0)
HEMOGLOBIN: 12.3 g/dL — AB (ref 13.0–18.0)
MCH: 29.1 pg (ref 26.0–34.0)
MCHC: 33.9 g/dL (ref 32.0–36.0)
MCV: 85.9 fL (ref 80.0–100.0)
Platelets: 188 10*3/uL (ref 150–440)
RBC: 4.21 MIL/uL — ABNORMAL LOW (ref 4.40–5.90)
RDW: 16.1 % — ABNORMAL HIGH (ref 11.5–14.5)
WBC: 6.5 10*3/uL (ref 3.8–10.6)

## 2015-04-24 LAB — GLUCOSE, CAPILLARY
GLUCOSE-CAPILLARY: 153 mg/dL — AB (ref 65–99)
GLUCOSE-CAPILLARY: 160 mg/dL — AB (ref 65–99)
GLUCOSE-CAPILLARY: 198 mg/dL — AB (ref 65–99)
Glucose-Capillary: 188 mg/dL — ABNORMAL HIGH (ref 65–99)

## 2015-04-24 MED ORDER — INSULIN ASPART PROT & ASPART (70-30 MIX) 100 UNIT/ML ~~LOC~~ SUSP
10.0000 [IU] | Freq: Two times a day (BID) | SUBCUTANEOUS | Status: DC
  Administered 2015-04-24 – 2015-04-25 (×2): 10 [IU] via SUBCUTANEOUS
  Filled 2015-04-24 (×2): qty 10

## 2015-04-24 MED ORDER — GLUCERNA SHAKE PO LIQD
237.0000 mL | Freq: Two times a day (BID) | ORAL | Status: DC
  Administered 2015-04-24 – 2015-04-25 (×2): 237 mL via ORAL

## 2015-04-24 NOTE — Plan of Care (Signed)
Problem: Acute Rehab PT Goals(only PT should resolve) Goal: Patient Will Transfer Sit To/From Stand Pt will transfer sit to/from-stand 5x without UE support and without loss-of-balance in less than 15s and maintaining SaO2>89% to demonstrate safe and ndependent mobility in home.     Goal: Pt Will Ambulate Pt will ambulate with modified indep using a step-through pattern and equal step length for a distances greater than 31900ft and SaO2 >89% on 2LO2 or less to demonstrate the ability to perform safe household distance ambulation at discharge.

## 2015-04-24 NOTE — Evaluation (Signed)
Physical Therapy Evaluation Patient Details Name: Kenneth Massey MRN: [id number] DOB: 1929-11-24 Today's Date: 04/24/2015   History of Present Illness  81yo Male here last week c CHF exacerbation, and DC with O2 for home, comes in with worsening SOB and LE swelling. Daughter and pt describe some miscommunitcation regarding new lasix Rx which pt self DC after leaving here on 12/20. Pt monitors O2 at home and uses O2 intermittently.   Clinical Impression  Pt is A&Ox4 and reporting some confusion regarding DC instructions from last visit. He demonstrates similar balance impairment compared to admission last week. Pt demonstrates what is worsening activity tolerance to household distance ambulation, as evidenced by onset of instability in legs after walking 12450ft and sustained drop in SaO2 requiring 60 seconds to return to 92% on 2L. Recommending DC to home with intermittent supervision and HHPT services, as patient is not able to tolerate household distances. Pt will benefit from skilled PT intervention to address the above deficits and impairments, and to restore to PLOF.     Follow Up Recommendations Home health PT    Equipment Recommendations  None recommended by PT    Recommendations for Other Services       Precautions / Restrictions Precautions Precautions: Fall Restrictions Weight Bearing Restrictions: No      Mobility  Bed Mobility Overal bed mobility: Modified Independent             General bed mobility comments: Patient uses bedrails, no need for cuing or balance deficits.   Transfers Overall transfer level: Modified independent Equipment used: None Transfers: Sit to/from Stand Sit to Stand: Supervision         General transfer comment: Patient does not lose balance or require any assistance to stabilize in sit to stand transfer. Uses hands bilat to push down into seat.   Ambulation/Gait Ambulation/Gait assistance: Supervision Ambulation Distance (Feet):  160 Feet   Gait Pattern/deviations: Decreased step length - right Gait velocity: 0.7371m/s: elevated falls risk.  Gait velocity interpretation: <1.8 ft/sec, indicative of risk for recurrent falls General Gait Details: walks on bent knees, says he feels SOB after 150 feet adn that his legs are about to give out; returns to room c SaO2: 87% on 2L.   Stairs            Wheelchair Mobility    Modified Rankin (Stroke Patients Only)       Balance Overall balance assessment: Needs assistance   Sitting balance-Leahy Scale: Good     Standing balance support: No upper extremity supported;During functional activity Standing balance-Leahy Scale: Good Standing balance comment: on 12/19 Tinetti of 22/28; Today Tinetti of 24/28.                              Pertinent Vitals/Pain Pain Assessment: No/denies pain    Home Living Family/patient expects to be discharged to:: Private residence Living Arrangements: Spouse/significant other Available Help at Discharge: Family Type of Home: House Home Access: Ramped entrance     Home Layout: One level Home Equipment: Walker - 2 wheels Additional Comments: does not use RW.     Prior Function Level of Independence: Independent         Comments: Very active and mobile up until recently, with new weakness in BLE.      Hand Dominance        Extremity/Trunk Assessment   Upper Extremity Assessment: Overall WFL for tasks assessed  Lower Extremity Assessment: Overall WFL for tasks assessed;Generalized weakness (unable to come to standing without BUE use. )      Cervical / Trunk Assessment:  (Downward gaze with upper thoracic kyphosis. )  Communication   Communication: No difficulties  Cognition Arousal/Alertness: Awake/alert Behavior During Therapy: WFL for tasks assessed/performed Overall Cognitive Status: Within Functional Limits for tasks assessed                      General Comments       Exercises        Assessment/Plan    PT Assessment Patient needs continued PT services  PT Diagnosis Difficulty walking;Generalized weakness;Abnormality of gait   PT Problem List Decreased strength;Decreased knowledge of use of DME;Decreased activity tolerance;Cardiopulmonary status limiting activity;Decreased balance;Decreased mobility;Decreased safety awareness  PT Treatment Interventions DME instruction;Gait training;Therapeutic activities;Therapeutic exercise;Balance training   PT Goals (Current goals can be found in the Care Plan section) Acute Rehab PT Goals Patient Stated Goal: return to homel improve strength in legs PT Goal Formulation: With patient Time For Goal Achievement: 05/08/15 Potential to Achieve Goals: Fair    Frequency Min 2X/week   Barriers to discharge        Co-evaluation               End of Session Equipment Utilized During Treatment: Gait belt Activity Tolerance: Patient tolerated treatment well;No increased pain;Patient limited by fatigue;Treatment limited secondary to medical complications (Comment) Patient left: in chair;with call bell/phone within reach;with chair alarm set Nurse Communication: Mobility status;Precautions         Time: 1610-9604 PT Time Calculation (min) (ACUTE ONLY): 19 min   Charges:   PT Evaluation $Initial PT Evaluation Tier I: 1 Procedure PT Treatments $Therapeutic Exercise: 8-22 mins   PT G Codes:        Yogesh Cominsky C 05/08/2015, 3:29 PM  3:33 PM  Rosamaria Lints, PT, DPT [place] License # [id number]

## 2015-04-24 NOTE — Progress Notes (Signed)
[facility]   PATIENT NAME: Kenneth Massey    MR#:  [id number]  DATE OF BIRTH:  06/17/1929  SUBJECTIVE:    No acute issues overnight. Patient subjectively reports less short of breath and lower extremity edema has improved. REVIEW OF SYSTEMS:    Review of Systems  Constitutional: Negative for fever, chills and malaise/fatigue.  HENT: Negative for sore throat.   Eyes: Negative for blurred vision.  Respiratory: Negative for cough, hemoptysis, shortness of breath and wheezing.   Cardiovascular: Positive for leg swelling. Negative for chest pain and palpitations.  Gastrointestinal: Negative for nausea, vomiting, abdominal pain, diarrhea and blood in stool.  Genitourinary: Negative for dysuria.  Musculoskeletal: Negative for back pain.  Neurological: Negative for dizziness, tremors and headaches.  Endo/Heme/Allergies: Does not bruise/bleed easily.    Tolerating Diet: Yes      DRUG ALLERGIES:  No Known Allergies  VITALS:  Blood pressure 117/64, pulse 79, temperature 98.1 F (36.7 C), temperature source Oral, resp. rate 18, height 6' (1.829 m), weight 72.802 kg (160 lb 8 oz), SpO2 100 %.  PHYSICAL EXAMINATION:   Physical Exam  Constitutional: He is oriented to person, place, and time and well-developed, well-nourished, and in no distress. No distress.  HENT:  Head: Normocephalic.  Eyes: No scleral icterus.  Neck: Normal range of motion. Neck supple. No JVD present. No tracheal deviation present.  Cardiovascular: Normal rate and regular rhythm.  Exam reveals no gallop and no friction rub.   Murmur heard. Pulmonary/Chest: Effort normal and breath sounds normal. No respiratory distress. He has no wheezes. He has no rales. He exhibits no tenderness.  Crackles at bases  Abdominal: Soft. Bowel sounds are normal. He exhibits no distension and no mass. There is no tenderness. There is no rebound and no guarding.  Musculoskeletal:  Normal range of motion. He exhibits edema.  Neurological: He is alert and oriented to person, place, and time.  Skin: Skin is warm. No rash noted. No erythema.  SCALY lower extremity with chronic skin changes some mild erythema  Psychiatric: Affect and judgment normal.      LABORATORY PANEL:   CBC  Recent Labs Lab 04/24/15 0541  WBC 6.5  HGB 12.3*  HCT 36.2*  PLT 188   ------------------------------------------------------------------------------------------------------------------  Chemistries   Recent Labs Lab 04/24/15 0541  NA 133*  K 4.0  CL 93*  CO2 32  GLUCOSE 151*  BUN 37*  CREATININE 1.69*  CALCIUM 8.5*   ------------------------------------------------------------------------------------------------------------------  Cardiac Enzymes  Recent Labs Lab 04/22/15 1434  TROPONINI 0.04*   ------------------------------------------------------------------------------------------------------------------  RADIOLOGY:  Dg Chest 2 View  04/22/2015  CLINICAL DATA:  Increased short of breath 2 days. EXAM: CHEST  2 VIEW COMPARISON:  Radiograph 04/13/2015 FINDINGS: Normal cardiac silhouette. The bilateral pleural effusions slightly increased compared to prior. Chronic bronchitic markings are noted. Mild interstitial edema similar. No pneumothorax. IMPRESSION: 1. Increasing bilateral pleural effusions. 2. Stable interstitial edema. Electronically Signed   By: Genevive Bi M.D.   On: 04/22/2015 16:18     ASSESSMENT AND PLAN:   81 year old male with a history of systolic heart failure EF of 45-50% who was recently discharged from the hospital with CHF who presents again with acute systolic on chronic systolic heart failure.  1. Acute systolic heart failure: Recent echocardiogram shows ejection fraction of 45-50%. Continue Lasix 20 mg his IV every 12 hours with potassium supplementation. Continue aspirin, Coreg and low-dose ACE inhibitor. Continue to monitor intake  and output.  Patient still with lower extremity edema and crackles on examination and will need one more day of IV Lasix.  2. Lower extremity cellulitis: Patient was being treated with Keflex as an outpatient for cellulitis. I can stop Keflex today he has completed his course of antibiotics.    3. Hypokalemia: Continue potassium supplementation.   4. Type 2 diabetes with chronic kidney disease stage III: Continue 70/30 with decreased dose. Continue Accu-Cheks and sliding scale insulin.    5. Essential hypertension: Continue Coreg and Vasotec.    Management plans discussed with the patient and he  is in agreement CODE STATUS:  FULL  TOTAL TIME TAKING CARE OF THIS PATIENT: 30 minutes.     POSSIBLE D/C 1-2 days, DEPENDING ON CLINICAL CONDITION.   Zyren Sevigny M.D on 04/24/2015 at 10:09 AM  Between 7am to 6pm - Pager - [phone] After 6pm go to [email or web address] - password [email or web address]  [facility]  Office  [phone]  CC: Primary care physician; Barbette ReichmannHANDE,VISHWANATH, MD  Note: This dictation was prepared with Dragon dictation along with smaller phrase technology. Any transcriptional errors that result from this process are unintentional.

## 2015-04-24 NOTE — Progress Notes (Signed)
[facility]  Patient: Gregary SignsWilliam E Sarsfield / Admit Date: 04/22/2015 / Date of Encounter: 04/24/2015, 8:11 AM   Subjective: Shortness of breath is improved but still somewhat weak patient is hemodynamically stable. There was no evidence of chest pain  Review of Systems: Positive for: Shortness of breath and weakness Negative for: Vision change, hearing change, syncope, dizziness, nausea, vomiting,diarrhea, bloody stool, stomach pain, cough, congestion, diaphoresis, urinary frequency, urinary pain,skin lesions, skin rashes Others previously listed  Objective: Telemetry: Normal sinus rhythm Physical Exam: Blood pressure 108/59, pulse 79, temperature 98.1 F (36.7 C), temperature source Oral, resp. rate 18, height 6' (1.829 m), weight 160 lb 8 oz (72.802 kg), SpO2 95 %. Body mass index is 21.76 kg/(m^2). General: Well developed, well nourished, in no acute distress. Head: Normocephalic, atraumatic, sclera non-icteric, no xanthomas, nares are without discharge. Neck: No apparent masses Lungs: Normal respirations with few wheezes, no rhonchi, no rales , basilar crackles   Heart: Regular rate and rhythm, normal S1 S2, no murmur, no rub, no gallop, PMI is normal size and placement, carotid upstroke normal without bruit, jugular venous pressure normal Abdomen: Soft, non-tender, non-distended with normoactive bowel sounds. No hepatosplenomegaly. Abdominal aorta is normal size without bruit Extremities: Trace to 1+ edema, no clubbing, no cyanosis, no ulcers,  Peripheral: 2+ radial, 0 + femoral, 1 + dorsal pedal pulses Neuro: Alert and oriented. Moves all extremities spontaneously. Psych:  Responds to questions appropriately with a normal affect.   Intake/Output Summary (Last 24 hours) at 04/24/15 0811 Last data filed at 04/24/15 0512  Gross per 24 hour  Intake    840 ml  Output    650 ml  Net    190 ml    Inpatient Medications:  . aspirin EC  81 mg Oral  Daily  . carvedilol  6.25 mg Oral BID WC  . cephALEXin  500 mg Oral TID  . cholecalciferol  2,000 Units Oral Daily  . enalapril  2.5 mg Oral BID  . enoxaparin (LOVENOX) injection  40 mg Subcutaneous Q24H  . furosemide  20 mg Intravenous Q12H  . insulin aspart protamine- aspart  25 Units Subcutaneous BID WC  . mometasone-formoterol  2 puff Inhalation BID  . multivitamin-lutein  1 capsule Oral BID  . potassium chloride  20 mEq Oral BID  . sodium chloride  3 mL Intravenous Q12H   Infusions:    Labs:  Recent Labs  04/23/15 0559 04/24/15 0541  NA 133* 133*  K 3.2* 4.0  CL 92* 93*  CO2 33* 32  GLUCOSE 209* 151*  BUN 35* 37*  CREATININE 1.57* 1.69*  CALCIUM 8.5* 8.5*   No results for input(s): AST, ALT, ALKPHOS, BILITOT, PROT, ALBUMIN in the last 72 hours.  Recent Labs  04/22/15 1434 04/24/15 0541  WBC 6.4 6.5  HGB 12.3* 12.3*  HCT 36.7* 36.2*  MCV 85.0 85.9  PLT 175 188    Recent Labs  04/22/15 1434  TROPONINI 0.04*   Invalid input(s): POCBNP No results for input(s): HGBA1C in the last 72 hours.   Weights: Filed Weights   04/22/15 1417 04/23/15 0028 04/23/15 0412  Weight: 155 lb (70.308 kg) 164 lb (74.39 kg) 160 lb 8 oz (72.802 kg)     Radiology/Studies:  Dg Chest 2 View  04/22/2015  CLINICAL DATA:  Increased short of breath 2 days. EXAM: CHEST  2 VIEW COMPARISON:  Radiograph 04/13/2015 FINDINGS: Normal cardiac silhouette. The bilateral pleural effusions slightly increased compared to prior. Chronic bronchitic  markings are noted. Mild interstitial edema similar. No pneumothorax. IMPRESSION: 1. Increasing bilateral pleural effusions. 2. Stable interstitial edema. Electronically Signed   By: Genevive Bi M.D.   On: 04/22/2015 16:18   Dg Chest 2 View  04/13/2015  CLINICAL DATA:  Shortness of Breath EXAM: CHEST - 2 VIEW COMPARISON:  09/30/2014 FINDINGS: Cardiac shadow is stable. Postsurgical changes are again noted. Bilateral pleural effusions left greater  than right are seen. Mild interstitial changes are noted which may be related to edema. No focal infiltrate is seen. No acute bony abnormality is noted. IMPRESSION: New bilateral pleural effusions left greater than right, changes of mild interstitial edema Electronically Signed   By: Alcide Clever M.D.   On: 04/13/2015 14:32     Assessment and Recommendation  81 y.o. male with acute on chronic systolic dysfunction congestive heart failure with previous coronary artery bypass graft and mitral valve repair with moderate aortic valve stenosis and mitral and tricuspid regurgitation chronic kidney disease stage III possibly secondary to multifactorial issues as well as dietary indiscretion now slightly improved without evidence of myocardial infarction with minimal elevation of troponin consistent with demand ischemia 1. Continue intravenous Lasix for pulmonary edema and lower extremity edema and changed to oral Lasix as soon as possible  2. Continue beta blocker carvedilol for cardiomyopathy and risk reduction in heart failure 3. Dietary consult to reduce sodium content when patient goes home 4. No further cardiac diagnostics necessary at this time 5. Continue ambulation and adjustments of medications with follow-up from cardiology standpoint in one to 2 weeks  Signed, Arnoldo Hooker M.D. FACC

## 2015-04-24 NOTE — Progress Notes (Addendum)
Inpatient Diabetes Program Recommendations  AACE/ADA: New Consensus Statement on Inpatient Glycemic Control (2015)  Target Ranges:  Prepandial:   less than 140 mg/dL      Peak postprandial:   less than 180 mg/dL (1-2 hours)      Critically ill patients:  140 - 180 mg/dL  Results for Kenneth Massey, Kenneth Massey (MRN [id number]) as of 04/24/2015 07:53  Ref. Range 04/24/2015 05:41  Glucose Latest Ref Range: 65-99 mg/dL 914 (H)   Results for Kenneth Massey, Kenneth Massey (MRN [id number]) as of 04/24/2015 07:53  Ref. Range 04/23/2015 07:40 04/23/2015 11:42 04/23/2015 13:27 04/23/2015 15:31 04/23/2015 16:04 04/23/2015 16:38 04/23/2015 17:38 04/23/2015 21:22  Glucose-Capillary Latest Ref Range: 65-99 mg/dL 086 (H) 99 578 (H) 64 (L) 66 64 (L) 143 (H) 253 (H)   Review of Glycemic Control  Diabetes history: DM2 Outpatient Diabetes medications: 70/30 25 units BID, Amaryl 4 mg BID Current orders for Inpatient glycemic control: 70/30 25 units BID  Inpatient Diabetes Program Recommendations: Insulin - Basal: Patient received 70/30 25 units on 04/23/15 at 8:34 am and glucose down to 64 mg/dl on 46/96/29 at 52:84. Patient did NOT receive any 70/30 yesterday evening and fasting glucose is 151 mg/dl. Please consider decreasing 70/30 to 10 units BID (with breakfast and supper). Correction (SSI): Please consider ordering CBGs with Novolog sensitive correction scale ACHS.   04/24/15@14 :12-Spoke with patient about diabetes and home regimen for diabetes control. Patient reports that he is followed by his PCP for diabetes management and currently he takes 70/30 10 units QAM (if CBG < 200 mg/dl) or 13/24 20 units QAM (if CBG >200 mg/dl), sometimes he takes 40/10 in the evening if glucose is greater than 200 mg/dl (may take 5 or 10 units), and Armaryl 8 mg once a day as an outpatient for diabetes control. Patient states that he checks his glucose at least once a day in the morning before breakfast and sometimes he checks it in the  evening "just depends on how I feel and how my sugar was that morning".  Inquired about knowledge about A1C and patient reports that he knows what an A1C is and reports that his last A1C was high but he does not recall the exact value. Discussed A1C results (10.8% on 04/14/15) and explained that current A1C indicates that his glucose average is about 264 mg/dl over the past 2-3 months. Patient states that he was on Prednisone 2-3 months ago on 2 different occassions. Discussed impact of steroids on glycemic control.  Discussed basic pathophysiology of DM Type 2, basic home care, importance of checking CBGs and maintaining good CBG control to prevent long-term and short-term complications. Discussed impact of nutrition, exercise, stress, and sickness on diabetes control. Reviewed 70/30 insulin and how it should be taken. Explained duration of 70/30 and importance of taking 70/30 twice a day with breakfast and supper.  Discussed current regimen of 70/30 10 units BID that is currently ordered as an inpatient. Patient states that he may not need to take the 70/30 this evening. Patient states that he knows his body and he has learned how to adjust his insulin dose depending on how his sugar is running. Again explained duration of 70/30 and explained that if no 70/30 insulin was given this evening with supper, it may cause his fasting glucose to be much more elevated.  Encouraged patient to check glucose at least twice a day and to keep a log of glucose readings and how much insulin he is actually taking each time. Asked  patient to take the glucose log with him to this follow up doctor appointments. Explained how his doctor can use the information to help him make adjustments with his diabetes medication.  Patient verbalized understanding of information discussed and he states that he has no further questions at this time related to diabetes.   Thanks, Orlando PennerMarie Maham Quintin, RN, MSN, CDE Diabetes Coordinator Inpatient Diabetes  Program [phone] (Team Pager from 8am to 5pm) [phone] ([facility] office) [phone] [facility] office) [phone] [facility] office)

## 2015-04-24 NOTE — Progress Notes (Signed)
Patient is alert and oriented, VSS, no complaints of pain.  Patient states his legs "look better" and he "feels better" overall.  PT consult pending.

## 2015-04-25 LAB — BASIC METABOLIC PANEL WITH GFR
Anion gap: 5 (ref 5–15)
BUN: 40 mg/dL — ABNORMAL HIGH (ref 6–20)
CO2: 33 mmol/L — ABNORMAL HIGH (ref 22–32)
Calcium: 8.4 mg/dL — ABNORMAL LOW (ref 8.9–10.3)
Chloride: 96 mmol/L — ABNORMAL LOW (ref 101–111)
Creatinine, Ser: 1.85 mg/dL — ABNORMAL HIGH (ref 0.61–1.24)
GFR calc Af Amer: 37 mL/min — ABNORMAL LOW
GFR calc non Af Amer: 32 mL/min — ABNORMAL LOW
Glucose, Bld: 104 mg/dL — ABNORMAL HIGH (ref 65–99)
Potassium: 4.1 mmol/L (ref 3.5–5.1)
Sodium: 134 mmol/L — ABNORMAL LOW (ref 135–145)

## 2015-04-25 LAB — GLUCOSE, CAPILLARY
Glucose-Capillary: 99 mg/dL (ref 65–99)
Glucose-Capillary: 143 mg/dL — ABNORMAL HIGH (ref 65–99)

## 2015-04-25 MED ORDER — GLUCERNA SHAKE PO LIQD: 237.0000 mL | Freq: Two times a day (BID) | ORAL | Status: AC

## 2015-04-25 NOTE — Progress Notes (Signed)
Patient d/c'd home with HH and oxygen. Education provided, no questions at this time. Patient to be picked up by wife. Telemetry removed. Trudee KusterBrandi R Mansfield

## 2015-04-25 NOTE — Discharge Summary (Signed)
[facility] [place] [facility]   PATIENT NAME: Kenneth Massey    MR#:  [id number]  DATE OF BIRTH:  [birth year]  DATE OF ADMISSION:  04/22/2015 ADMITTING PHYSICIAN: Ramonita LabAruna Gouru, MD  DATE OF DISCHARGE: 12/292016 PRIMARY CARE PHYSICIAN: Barbette ReichmannHANDE,VISHWANATH, MD    ADMISSION DIAGNOSIS:  Hypoxia [R09.02] Acute congestive heart failure, unspecified congestive heart failure type (HCC) [I50.9]  DISCHARGE DIAGNOSIS:  Active Problems:   CHF exacerbation (HCC)   Pressure ulcer   SECONDARY DIAGNOSIS:   Past Medical History  Diagnosis Date  . COPD (chronic obstructive pulmonary disease) (HCC)   . Hypertension   . DM type 2 (diabetes mellitus, type 2) (HCC)   . CKD (chronic kidney disease)   . CAD (coronary artery disease)   . Diabetes mellitus with neuropathy [facility](HCC)     HOSPITAL COURSE:   81 year old male with a history of systolic heart failure EF of 45-50% who was recently discharged from the hospital with CHF who presents again with acute systolic on chronic systolic heart failure.  1. Acute systolic heart failure: Recent echocardiogram shows ejection fraction of 45-50%.  He was started on Lasix 20 mg his IV every 12 hours with potassium supplementation. He will continue aspirin, Coreg and Lasix.  He has outpatient follow-up with CHF clinic. [facility] in [facility]. He was started on low-dose ACE inhibitor. His creatinine at discharge is 1.8 which is run his baseline however I'm hesitant to continue with in addition to Lasix until he has follow-up by his cardiologist which will be early next week.   2. Lower extremity cellulitis: Patient was being treated with Keflex as an outpatient for cellulitis. He has completed his treatment.  3. Hypokalemia: Patient's potassium is 4.1 at discharge 4. Type 2 diabetes with chronic kidney disease stage III: Patient will continue his outpatient medications. 5. Essential  hypertension: Continue Coreg.   DISCHARGE CONDITIONS AND DIET:  Patient is stable for discharge on a heart healthy and diabetic diet  CONSULTS OBTAINED:  Treatment Team:  Lamar BlinksBruce J Kowalski, MD  DRUG ALLERGIES:  No Known Allergies  DISCHARGE MEDICATIONS:   Current Discharge Medication List    START taking these medications   Details  feeding supplement, GLUCERNA SHAKE, (GLUCERNA SHAKE) LIQD Take 237 mLs by mouth 2 (two) times daily between meals. Qty: 30 Can, Refills: 0      CONTINUE these medications which have NOT CHANGED   Details  albuterol (PROVENTIL HFA;VENTOLIN HFA) 108 (90 BASE) MCG/ACT inhaler Inhale 2 puffs into the lungs every 4 (four) hours as needed for wheezing or shortness of breath. Qty: 1 Inhaler, Refills: 1    aspirin EC 81 MG tablet Take 81 mg by mouth daily.    carvedilol (COREG) 6.25 MG tablet Take 6.25 mg by mouth 2 (two) times daily with a meal.    Cholecalciferol (VITAMIN D3) 2000 UNITS capsule Take 2,000 Units by mouth daily.    Fluticasone-Salmeterol (ADVAIR) 250-50 MCG/DOSE AEPB Inhale 1 puff into the lungs 2 (two) times daily.    furosemide (LASIX) 20 MG tablet Take 20 mg by mouth daily.    glimepiride (AMARYL) 4 MG tablet Take 4 mg by mouth 2 (two) times daily.    HUMULIN 70/30 (70-30) 100 UNIT/ML injection Inject 25 Units into the skin 2 (two) times daily before a meal. Qty: 10 mL, Refills: 5    Multiple Vitamins-Minerals (PRESERVISION AREDS 2 PO) Take 1 capsule by mouth 2 (two) times daily.  vitamin E (VITAMIN E) 1000 UNIT capsule Take 1,000 Units by mouth daily.      STOP taking these medications     cephALEXin (KEFLEX) 500 MG capsule               Today   CHIEF COMPLAINT:  Patient is doing well this morning. Patient denies chest pain or shortness of breath. His lower extremity edema has improved.   VITAL SIGNS:  Blood pressure 127/59, pulse 81, temperature 98.3 F (36.8 C), temperature source Oral, resp. rate 18,  height 6' (1.829 m), weight 72.802 kg (160 lb 8 oz), SpO2 97 %.   REVIEW OF SYSTEMS:  Review of Systems  Constitutional: Negative for fever, chills and malaise/fatigue.  HENT: Negative for sore throat.   Eyes: Negative for blurred vision.  Respiratory: Negative for cough, hemoptysis, shortness of breath and wheezing.   Cardiovascular: Positive for leg swelling. Negative for chest pain and palpitations.  Gastrointestinal: Negative for nausea, vomiting, abdominal pain, diarrhea and blood in stool.  Genitourinary: Negative for dysuria.  Musculoskeletal: Negative for back pain.  Neurological: Negative for dizziness, tremors and headaches.  Endo/Heme/Allergies: Does not bruise/bleed easily.     PHYSICAL EXAMINATION:  GENERAL:  81 y.o.-year-old patient lying in the bed with no acute distress.  NECK:  Supple, no jugular venous distention. No thyroid enlargement, no tenderness.  LUNGS: Normal breath sounds bilaterally, no wheezing, rales,rhonchi  No use of accessory muscles of respiration.  CARDIOVASCULAR: S1, S2 normal. No murmurs, rubs, or gallops.  ABDOMEN: Soft, non-tender, non-distended. Bowel sounds present. No organomegaly or mass.  EXTREMITIES: 1+ pitting edema laterally.  PSYCHIATRIC: The patient is alert and oriented x 3.  SKIN: Scaly lower extremity no erythema or tenderness.  DATA REVIEW:   CBC  Recent Labs Lab 04/24/15 0541  WBC 6.5  HGB 12.3*  HCT 36.2*  PLT 188    Chemistries   Recent Labs Lab 04/25/15 0534  NA 134*  K 4.1  CL 96*  CO2 33*  GLUCOSE 104*  BUN 40*  CREATININE 1.85*  CALCIUM 8.4*    Cardiac Enzymes  Recent Labs Lab 04/22/15 1434  TROPONINI 0.04*    Microbiology Results  @  RADIOLOGY:  No results found.    Management plans discussed with the patient and he is in agreement. Stable for discharge home with hhc  Patient should follow up with CARDIOLOGY  NEXT WEEK  CODE STATUS:     Code Status Orders         Start     Ordered   04/22/15 2039  Full code   Continuous     04/22/15 2038    Advance Directive Documentation        Most Recent Value   Type of Advance Directive  Living will   Pre-existing out of facility DNR order (yellow form or pink MOST form)     "MOST" Form in Place?        TOTAL TIME TAKING CARE OF THIS PATIENT: 35 minutes.    Note: This dictation was prepared with Dragon dictation along with smaller phrase technology. Any transcriptional errors that result from this process are unintentional.  Porchea Charrier M.D on 04/25/2015 at 10:25 AM  Between 7am to 6pm - Pager - [phone] After 6pm go to [email or web address] - password [email or web address] [facility]  [place]  Office  [phone]  CC: Primary care physician; Barbette Reichmann, MD

## 2015-04-25 NOTE — Care Management Important Message (Signed)
Important Message  Patient Details  Name: Kenneth Massey MRN: [id number] Date of Birth: June 11, 1929   Medicare Important Message Given:  Yes    Olegario MessierKathy A Danniella Robben 04/25/2015, 11:33 AM

## 2015-04-25 NOTE — Care Management (Signed)
Patient to discharge today with home health PT and nursing.  Dennie Bibleat has selected to continue services with [facility].  Barbara CowerJason from [facility] notified of pending discharge.   Patient has previous orders for continuous home oxygen.  Will from [facility] to deliver portable tank for discharge. Patient and wife still have questions regarding home oxygen set up.  Will from [facility] has notified staff that a representative will need to go out for additional education. Wife states that she will be present at approximately 130 to transport patient after discharge.  RNCM signing off.

## 2015-05-03 ENCOUNTER — Emergency Department: Payer: Medicare Other

## 2015-05-03 ENCOUNTER — Encounter: Payer: Self-pay | Admitting: Emergency Medicine

## 2015-05-03 ENCOUNTER — Other Ambulatory Visit: Payer: Self-pay

## 2015-05-03 ENCOUNTER — Emergency Department
Admission: EM | Admit: 2015-05-03 | Discharge: 2015-05-03 | Disposition: A | Discharge status: Home / Self Care | Payer: Medicare Other | Attending: Emergency Medicine | Admitting: Emergency Medicine

## 2015-05-03 DIAGNOSIS — Z79899 Other long term (current) drug therapy: Secondary | ICD-10-CM | POA: Insufficient documentation

## 2015-05-03 DIAGNOSIS — J441 Chronic obstructive pulmonary disease with (acute) exacerbation: Secondary | ICD-10-CM | POA: Diagnosis not present

## 2015-05-03 DIAGNOSIS — Z7982 Long term (current) use of aspirin: Secondary | ICD-10-CM | POA: Insufficient documentation

## 2015-05-03 DIAGNOSIS — Z7951 Long term (current) use of inhaled steroids: Secondary | ICD-10-CM | POA: Diagnosis not present

## 2015-05-03 DIAGNOSIS — Z87891 Personal history of nicotine dependence: Secondary | ICD-10-CM | POA: Insufficient documentation

## 2015-05-03 DIAGNOSIS — I129 Hypertensive chronic kidney disease with stage 1 through stage 4 chronic kidney disease, or unspecified chronic kidney disease: Secondary | ICD-10-CM | POA: Insufficient documentation

## 2015-05-03 DIAGNOSIS — N189 Chronic kidney disease, unspecified: Secondary | ICD-10-CM | POA: Diagnosis not present

## 2015-05-03 DIAGNOSIS — I5040 Unspecified combined systolic (congestive) and diastolic (congestive) heart failure: Secondary | ICD-10-CM | POA: Insufficient documentation

## 2015-05-03 DIAGNOSIS — Z794 Long term (current) use of insulin: Secondary | ICD-10-CM | POA: Insufficient documentation

## 2015-05-03 DIAGNOSIS — R0600 Dyspnea, unspecified: Secondary | ICD-10-CM | POA: Diagnosis present

## 2015-05-03 DIAGNOSIS — E1140 Type 2 diabetes mellitus with diabetic neuropathy, unspecified: Secondary | ICD-10-CM | POA: Diagnosis not present

## 2015-05-03 LAB — BLOOD GAS, ARTERIAL
ACID-BASE EXCESS: 7 mmol/L — AB (ref 0.0–3.0)
BICARBONATE: 27.4 meq/L (ref 21.0–28.0)
FIO2: 28
O2 SAT: 99.4 %
PCO2 ART: 26 mmHg — AB (ref 32.0–48.0)
PH ART: 7.63 — AB (ref 7.350–7.450)
PO2 ART: 126 mmHg — AB (ref 83.0–108.0)
Patient temperature: 37

## 2015-05-03 LAB — CBC WITH DIFFERENTIAL/PLATELET
BASOS PCT: 0 %
Basophils Absolute: 0 10*3/uL (ref 0–0.1)
EOS ABS: 0.1 10*3/uL (ref 0–0.7)
Eosinophils Relative: 1 %
HEMATOCRIT: 37.7 % — AB (ref 40.0–52.0)
HEMOGLOBIN: 12.4 g/dL — AB (ref 13.0–18.0)
LYMPHS ABS: 0.7 10*3/uL — AB (ref 1.0–3.6)
Lymphocytes Relative: 10 %
MCH: 28.2 pg (ref 26.0–34.0)
MCHC: 32.9 g/dL (ref 32.0–36.0)
MCV: 85.7 fL (ref 80.0–100.0)
MONOS PCT: 11 %
Monocytes Absolute: 0.7 10*3/uL (ref 0.2–1.0)
NEUTROS ABS: 5.2 10*3/uL (ref 1.4–6.5)
NEUTROS PCT: 78 %
Platelets: 189 10*3/uL (ref 150–440)
RBC: 4.4 MIL/uL (ref 4.40–5.90)
RDW: 16.5 % — ABNORMAL HIGH (ref 11.5–14.5)
WBC: 6.7 10*3/uL (ref 3.8–10.6)

## 2015-05-03 LAB — COMPREHENSIVE METABOLIC PANEL
ALBUMIN: 3.3 g/dL — AB (ref 3.5–5.0)
ALK PHOS: 64 U/L (ref 38–126)
ALT: 15 U/L — AB (ref 17–63)
ANION GAP: 9 (ref 5–15)
AST: 21 U/L (ref 15–41)
BILIRUBIN TOTAL: 0.9 mg/dL (ref 0.3–1.2)
BUN: 36 mg/dL — ABNORMAL HIGH (ref 6–20)
CALCIUM: 8.9 mg/dL (ref 8.9–10.3)
CO2: 32 mmol/L (ref 22–32)
CREATININE: 1.67 mg/dL — AB (ref 0.61–1.24)
Chloride: 96 mmol/L — ABNORMAL LOW (ref 101–111)
GFR calc Af Amer: 42 mL/min — ABNORMAL LOW (ref >60)
GFR calc non Af Amer: 36 mL/min — ABNORMAL LOW (ref >60)
GLUCOSE: 142 mg/dL — AB (ref 65–99)
Potassium: 3.9 mmol/L (ref 3.5–5.1)
Sodium: 137 mmol/L (ref 135–145)
TOTAL PROTEIN: 6.6 g/dL (ref 6.5–8.1)

## 2015-05-03 LAB — BRAIN NATRIURETIC PEPTIDE: B Natriuretic Peptide: 2392 pg/mL — ABNORMAL HIGH (ref 0.0–100.0)

## 2015-05-03 LAB — TROPONIN I: Troponin I: 0.04 ng/mL — ABNORMAL HIGH (ref <0.031)

## 2015-05-03 LAB — GLUCOSE, CAPILLARY
Glucose-Capillary: 170 mg/dL — ABNORMAL HIGH (ref 65–99)
Glucose-Capillary: 241 mg/dL — ABNORMAL HIGH (ref 65–99)

## 2015-05-03 MED ORDER — IOHEXOL 350 MG/ML SOLN
75.0000 mL | Freq: Once | INTRAVENOUS | Status: AC | PRN
  Administered 2015-05-03: 75 mL via INTRAVENOUS

## 2015-05-03 NOTE — ED Provider Notes (Signed)
[facility] Emergency Department Provider Note  ____________________________________________  Time seen: Approximately 7:06 PM  I have reviewed the triage vital signs and the nursing notes.   HISTORY  Chief Complaint Respiratory Distress    HPI Kenneth Massey is a 82 y.o. male patient and family tell me that he was sent in because he's gained 9 pounds. Patient himself says he is really not short of breath not affect is slightly less short of breath than usual on oxygen at home. He recently had his diuretic changed. That was 2 days ago. Patient does say his legs are swelling  more than usual.   Past Medical History  Diagnosis Date  . COPD (chronic obstructive pulmonary disease) (HCC)   . Hypertension   . DM type 2 (diabetes mellitus, type 2) (HCC)   . CKD (chronic kidney disease)   . CAD (coronary artery disease)   . Diabetes mellitus with neuropathy [facility])     Patient Active Problem List   Diagnosis Date Noted  . Pressure ulcer 04/23/2015  . CHF exacerbation (HCC) 04/22/2015  . CHF (congestive heart failure) (HCC) 04/13/2015    Past Surgical History  Procedure Laterality Date  . Coronary artery bypass graft    . Cholecystectomy    . Abdominal aortic aneurysm repair    . Renal artery stent      Current Outpatient Rx  Name  Route  Sig  Dispense  Refill  . albuterol (PROVENTIL HFA;VENTOLIN HFA) 108 (90 BASE) MCG/ACT inhaler   Inhalation   Inhale 2 puffs into the lungs every 4 (four) hours as needed for wheezing or shortness of breath.   1 Inhaler   1   . aspirin EC 81 MG tablet   Oral   Take 81 mg by mouth daily.         . carvedilol (COREG) 6.25 MG tablet   Oral   Take 6.25 mg by mouth 2 (two) times daily with a meal.         . Cholecalciferol (VITAMIN D3) 2000 UNITS capsule   Oral   Take 2,000 Units by mouth daily.         . feeding supplement, GLUCERNA SHAKE, (GLUCERNA SHAKE) LIQD   Oral   Take 237 mLs by mouth 2 (two)  times daily between meals.   30 Can   0   . Fluticasone-Salmeterol (ADVAIR) 250-50 MCG/DOSE AEPB   Inhalation   Inhale 1 puff into the lungs 2 (two) times daily.         . furosemide (LASIX) 20 MG tablet   Oral   Take 20 mg by mouth daily.         Marland Kitchen glimepiride (AMARYL) 4 MG tablet   Oral   Take 4 mg by mouth 2 (two) times daily.         Marland Kitchen HUMULIN 70/30 (70-30) 100 UNIT/ML injection   Subcutaneous   Inject 25 Units into the skin 2 (two) times daily before a meal.   10 mL   5     Dispense as written.   . Multiple Vitamins-Minerals (PRESERVISION AREDS 2 PO)   Oral   Take 1 capsule by mouth 2 (two) times daily.          . vitamin E (VITAMIN E) 1000 UNIT capsule   Oral   Take 1,000 Units by mouth daily.           Allergies Review of patient's allergies indicates no known allergies.  Family  History  Problem Relation Age of Onset  . Hypertension      Social History Social History  Substance Use Topics  . Smoking status: Former Games developermoker  . Smokeless tobacco: None  . Alcohol Use: No    Review of Systems Constitutional: No fever/chills Eyes: No visual changes. ENT: No sore throat. Cardiovascular: Denies chest pain. Respiratory: Denies shortness of breath. Gastrointestinal: No abdominal pain.  No nausea, no vomiting.  No diarrhea.  No constipation. Genitourinary: Negative for dysuria. Musculoskeletal: Negative for back pain. Skin: Negative for rash. Neurological: Negative for headaches, focal weakness or numbness.  10-point ROS otherwise negative.  ____________________________________________   PHYSICAL EXAM:  VITAL SIGNS: ED Triage Vitals  Enc Vitals Group     BP 05/03/15 1237 112/57 mmHg     Pulse Rate 05/03/15 1237 80     Resp 05/03/15 1237 22     Temp 05/03/15 1237 98.3 F (36.8 C)     Temp Source 05/03/15 1237 Oral     SpO2 05/03/15 1237 100 %     Weight 05/03/15 1237 171 lb (77.565 kg)     Height 05/03/15 1237 6' (1.829 m)     Head  Cir --      Peak Flow --      Pain Score --      Pain Loc --      Pain Edu? --      Excl. in GC? --     Constitutional: Alert and oriented. Well appearing and in no acute distress. Eyes: Conjunctivae are normal. PERRL. EOMI. Head: Atraumatic. Nose: No congestion/rhinnorhea. Mouth/Throat: Mucous membranes are moist.  Oropharynx non-erythematous. Neck: No stridor.   Cardiovascular: Normal rate, regular rhythm. Grossly normal heart sounds.  Good peripheral circulation. Respiratory: Normal respiratory effort.  No retractions. Lungs scattered crackles worse in bases Gastrointestinal: Soft and nontender. No distention. No abdominal bruits. No CVA tenderness. }Musculoskeletal: Patient has 2+ edema in the legs.  No joint effusions. Neurologic:  Normal speech and language. No gross focal neurologic deficits are appreciated. No gait instability. Skin:  Skin is warm, dry and intact. No rash noted. Psychiatric: Mood and affect are normal. Speech and behavior are normal.  ____________________________________________   LABS (all labs ordered are listed, but only abnormal results are displayed)  Labs Reviewed  BRAIN NATRIURETIC PEPTIDE - Abnormal; Notable for the following:    B Natriuretic Peptide 2392.0 (*)    All other components within normal limits  CBC WITH DIFFERENTIAL/PLATELET - Abnormal; Notable for the following:    Hemoglobin 12.4 (*)    HCT 37.7 (*)    RDW 16.5 (*)    Lymphs Abs 0.7 (*)    All other components within normal limits  COMPREHENSIVE METABOLIC PANEL - Abnormal; Notable for the following:    Chloride 96 (*)    Glucose, Bld 142 (*)    BUN 36 (*)    Creatinine, Ser 1.67 (*)    Albumin 3.3 (*)    ALT 15 (*)    GFR calc non Af Amer 36 (*)    GFR calc Af Amer 42 (*)    All other components within normal limits  BLOOD GAS, ARTERIAL - Abnormal; Notable for the following:    pH, Arterial 7.63 (*)    pCO2 arterial 26 (*)    pO2, Arterial 126 (*)    Acid-Base Excess  7.0 (*)    All other components within normal limits  TROPONIN I - Abnormal; Notable for the following:  Troponin I 0.04 (*)    All other components within normal limits  GLUCOSE, CAPILLARY - Abnormal; Notable for the following:    Glucose-Capillary 170 (*)    All other components within normal limits   ____________________________________________  EKG  EKG read and interpreted by me shows sinus rhythm at 80 left axis and T wave inversion laterally and one L V5 and V6. These changes were present on 1220 05/08/2024 and again on 12:30. ____________________________________________  RADIOLOGY  Chest x-ray shows bilateral oral effusions with improved vascular markings since previous chest x-ray per radiology CT read and interpreted by radiology shows some nodes which are probably reactive bilateral pleural effusions and improved vascularity there is no explanation for the patient's respiratory alkalosis ____________________________________________   PROCEDURES    ____________________________________________   INITIAL IMPRESSION / ASSESSMENT AND PLAN / ED COURSE  Pertinent labs & imaging results that were available during my care of the patient were reviewed by me and considered in my medical decision making (see chart for details). Patient offered admission does not want to stay in fact when he sits with the pulse ox on his finger oxygen level usually goes up to 98 and 99 given 100. Drops with exertion but goes up immediately given afterwards. Patient says he really isn't any worse than he has been I've written up discharge instructions offered patient admission one last time through the nurse will suggest to him that when he walks he increases his oxygen to 3-4 L while he is walking and turned back down to 2 L afterwards. ____________________________________________   FINAL CLINICAL IMPRESSION(S) / ED DIAGNOSES  Final diagnoses:  Combined systolic and diastolic congestive  heart failure, unspecified congestive heart failure chronicity (HCC)      Arnaldo Natal, MD 05/03/15 2033

## 2015-05-03 NOTE — Discharge Instructions (Signed)
Heart Failure °Heart failure is a condition in which the heart has trouble pumping blood. This means your heart does not pump blood efficiently for your body to work well. In some cases of heart failure, fluid may back up into your lungs or you may have swelling (edema) in your lower legs. Heart failure is usually a long-term (chronic) condition. It is important for you to take good care of yourself and follow your health care provider's treatment plan. °CAUSES  °Some health conditions can cause heart failure. Those health conditions include: °· High blood pressure (hypertension). Hypertension causes the heart muscle to work harder than normal. When pressure in the blood vessels is high, the heart needs to pump (contract) with more force in order to circulate blood throughout the body. High blood pressure eventually causes the heart to become stiff and weak. °· Coronary artery disease (CAD). CAD is the buildup of cholesterol and fat (plaque) in the arteries of the heart. The blockage in the arteries deprives the heart muscle of oxygen and blood. This can cause chest pain and may lead to a heart attack. High blood pressure can also contribute to CAD. °· Heart attack (myocardial infarction). A heart attack occurs when one or more arteries in the heart become blocked. The loss of oxygen damages the muscle tissue of the heart. When this happens, part of the heart muscle dies. The injured tissue does not contract as well and weakens the heart's ability to pump blood. °· Abnormal heart valves. When the heart valves do not open and close properly, it can cause heart failure. This makes the heart muscle pump harder to keep the blood flowing. °· Heart muscle disease (cardiomyopathy or myocarditis). Heart muscle disease is damage to the heart muscle from a variety of causes. These can include drug or alcohol abuse, infections, or unknown reasons. These can increase the risk of heart failure. °· Lung disease. Lung disease  makes the heart work harder because the lungs do not work properly. This can cause a strain on the heart, leading it to fail. °· Diabetes. Diabetes increases the risk of heart failure. High blood sugar contributes to high fat (lipid) levels in the blood. Diabetes can also cause slow damage to tiny blood vessels that carry important nutrients to the heart muscle. When the heart does not get enough oxygen and food, it can cause the heart to become weak and stiff. This leads to a heart that does not contract efficiently. °· Other conditions can contribute to heart failure. These include abnormal heart rhythms, thyroid problems, and low blood counts (anemia). °Certain unhealthy behaviors can increase the risk of heart failure, including: °· Being overweight. °· Smoking or chewing tobacco. °· Eating foods high in fat and cholesterol. °· Abusing illicit drugs or alcohol. °· Lacking physical activity. °SYMPTOMS  °Heart failure symptoms may vary and can be hard to detect. Symptoms may include: °· Shortness of breath with activity, such as climbing stairs. °· Persistent cough. °· Swelling of the feet, ankles, legs, or abdomen. °· Unexplained weight gain. °· Difficulty breathing when lying flat (orthopnea). °· Waking from sleep because of the need to sit up and get more air. °· Rapid heartbeat. °· Fatigue and loss of energy. °· Feeling light-headed, dizzy, or close to fainting. °· Loss of appetite. °· Nausea. °· Increased urination during the night (nocturia). °DIAGNOSIS  °A diagnosis of heart failure is based on your history, symptoms, physical examination, and diagnostic tests. Diagnostic tests for heart failure may include: °·   Echocardiography. °· Electrocardiography. °· Chest X-ray. °· Blood tests. °· Exercise stress test. °· Cardiac angiography. °· Radionuclide scans. °TREATMENT  °Treatment is aimed at managing the symptoms of heart failure. Medicines, behavioral changes, or surgical intervention may be necessary to  treat heart failure. °· Medicines to help treat heart failure may include: °¨ Angiotensin-converting enzyme (ACE) inhibitors. This type of medicine blocks the effects of a blood protein called angiotensin-converting enzyme. ACE inhibitors relax (dilate) the blood vessels and help lower blood pressure. °¨ Angiotensin receptor blockers (ARBs). This type of medicine blocks the actions of a blood protein called angiotensin. Angiotensin receptor blockers dilate the blood vessels and help lower blood pressure. °¨ Water pills (diuretics). Diuretics cause the kidneys to remove salt and water from the blood. The extra fluid is removed through urination. This loss of extra fluid lowers the volume of blood the heart pumps. °¨ Beta blockers. These prevent the heart from beating too fast and improve heart muscle strength. °¨ Digitalis. This increases the force of the heartbeat. °· Healthy behavior changes include: °¨ Obtaining and maintaining a healthy weight. °¨ Stopping smoking or chewing tobacco. °¨ Eating heart-healthy foods. °¨ Limiting or avoiding alcohol. °¨ Stopping illicit drug use. °¨ Physical activity as directed by your health care provider. °· Surgical treatment for heart failure may include: °¨ A procedure to open blocked arteries, repair damaged heart valves, or remove damaged heart muscle tissue. °¨ A pacemaker to improve heart muscle function and control certain abnormal heart rhythms. °¨ An internal cardioverter defibrillator to treat certain serious abnormal heart rhythms. °¨ A left ventricular assist device (LVAD) to assist the pumping ability of the heart. °HOME CARE INSTRUCTIONS  °· Take medicines only as directed by your health care provider. Medicines are important in reducing the workload of your heart, slowing the progression of heart failure, and improving your symptoms. °¨ Do not stop taking your medicine unless directed by your health care provider. °¨ Do not skip any dose of medicine. °¨ Refill your  prescriptions before you run out of medicine. Your medicines are needed every day. °· Engage in moderate physical activity if directed by your health care provider. Moderate physical activity can benefit some people. The elderly and people with severe heart failure should consult with a health care provider for physical activity recommendations. °· Eat heart-healthy foods. Food choices should be free of trans fat and low in saturated fat, cholesterol, and salt (sodium). Healthy choices include fresh or frozen fruits and vegetables, fish, lean meats, legumes, fat-free or low-fat dairy products, and whole grain or high fiber foods. Talk to a dietitian to learn more about heart-healthy foods. °· Limit sodium if directed by your health care provider. Sodium restriction may reduce symptoms of heart failure in some people. Talk to a dietitian to learn more about heart-healthy seasonings. °· Use healthy cooking methods. Healthy cooking methods include roasting, grilling, broiling, baking, poaching, steaming, or stir-frying. Talk to a dietitian to learn more about healthy cooking methods. °· Limit fluids if directed by your health care provider. Fluid restriction may reduce symptoms of heart failure in some people. °· Weigh yourself every day. Daily weights are important in the early recognition of excess fluid. You should weigh yourself every morning after you urinate and before you eat breakfast. Wear the same amount of clothing each time you weigh yourself. Record your daily weight. Provide your health care provider with your weight record. °· Monitor and record your blood pressure if directed by your health care   provider.  Check your pulse if directed by your health care provider.  Lose weight if directed by your health care provider. Weight loss may reduce symptoms of heart failure in some people.  Stop smoking or chewing tobacco. Nicotine makes your heart work harder by causing your blood vessels to constrict.  Do not use nicotine gum or patches before talking to your health care provider.  Keep all follow-up visits as directed by your health care provider. This is important.  Limit alcohol intake to no more than 1 drink per day for nonpregnant women and 2 drinks per day for men. One drink equals 12 ounces of beer, 5 ounces of wine, or 1 ounces of hard liquor. Drinking more than that is harmful to your heart. Tell your health care provider if you drink alcohol several times a week. Talk with your health care provider about whether alcohol is safe for you. If your heart has already been damaged by alcohol or you have severe heart failure, drinking alcohol should be stopped completely.  Stop illicit drug use.  Stay up-to-date with immunizations. It is especially important to prevent respiratory infections through current pneumococcal and influenza immunizations.  Manage other health conditions such as hypertension, diabetes, thyroid disease, or abnormal heart rhythms as directed by your health care provider.  Learn to manage stress.  Plan rest periods when fatigued.  Learn strategies to manage high temperatures. If the weather is extremely hot:  Avoid vigorous physical activity.  Use air conditioning or fans or seek a cooler location.  Avoid caffeine and alcohol.  Wear loose-fitting, lightweight, and light-colored clothing.  Learn strategies to manage cold temperatures. If the weather is extremely cold:  Avoid vigorous physical activity.  Layer clothes.  Wear mittens or gloves, a hat, and a scarf when going outside.  Avoid alcohol.  Obtain ongoing education and support as needed.  Participate in or seek rehabilitation as needed to maintain or improve independence and quality of life. SEEK MEDICAL CARE IF:   You have a rapid weight gain.  You have increasing shortness of breath that is unusual for you.  You are unable to participate in your usual physical activities.  You tire  easily.  You cough more than normal, especially with physical activity.  You have any or more swelling in areas such as your hands, feet, ankles, or abdomen.  You are unable to sleep because it is hard to breathe.  You feel like your heart is beating fast (palpitations).  You become dizzy or light-headed upon standing up. SEEK IMMEDIATE MEDICAL CARE IF:   You have difficulty breathing.  There is a change in mental status such as decreased alertness or difficulty with concentration.  You have a pain or discomfort in your chest.  You have an episode of fainting (syncope). MAKE SURE YOU:   Understand these instructions.  Will watch your condition.  Will get help right away if you are not doing well or get worse.   This information is not intended to replace advice given to you by your health care provider. Make sure you discuss any questions you have with your health care provider.   Document Released: 04/13/2005 Document Revised: 08/28/2014 Document Reviewed: 05/13/2012 Elsevier Interactive Patient Education [employer]  Please be sure to rest at least a couple hours a day with your feet up above the level of your heart. That will help the swelling and help the fluid pill work to get rid of the extra fluid in your  body. Please call 911 and return if you get more short of breath or have any other problems. Please be sure to follow up with your doctor this coming week.

## 2015-05-03 NOTE — ED Notes (Signed)
Patient transported to CT 

## 2015-05-03 NOTE — ED Notes (Signed)
MD at bedside. 

## 2015-05-03 NOTE — ED Notes (Signed)
Retaining fluid to lower extremities, on a fluid pill, increased difficulty breathing, on 2 liters O2 at home ,

## 2015-05-14 ENCOUNTER — Ambulatory Visit: Payer: Medicare Other | Admitting: Family

## 2016-07-14 ENCOUNTER — Other Ambulatory Visit (INDEPENDENT_AMBULATORY_CARE_PROVIDER_SITE_OTHER): Payer: Self-pay | Admitting: Vascular Surgery

## 2016-07-14 DIAGNOSIS — I6523 Occlusion and stenosis of bilateral carotid arteries: Secondary | ICD-10-CM

## 2016-07-15 ENCOUNTER — Ambulatory Visit (INDEPENDENT_AMBULATORY_CARE_PROVIDER_SITE_OTHER): Payer: Self-pay | Admitting: Vascular Surgery

## 2016-07-15 ENCOUNTER — Encounter (INDEPENDENT_AMBULATORY_CARE_PROVIDER_SITE_OTHER): Payer: Medicare Other

## 2016-07-16 ENCOUNTER — Ambulatory Visit (INDEPENDENT_AMBULATORY_CARE_PROVIDER_SITE_OTHER): Payer: Medicare Other

## 2016-07-16 ENCOUNTER — Encounter (INDEPENDENT_AMBULATORY_CARE_PROVIDER_SITE_OTHER): Payer: Self-pay | Admitting: Vascular Surgery

## 2016-07-16 ENCOUNTER — Ambulatory Visit (INDEPENDENT_AMBULATORY_CARE_PROVIDER_SITE_OTHER): Payer: Medicare Other | Admitting: Vascular Surgery

## 2016-07-16 VITALS — BP 136/74 | HR 86 | Resp 16 | Wt 161.0 lb

## 2016-07-16 DIAGNOSIS — I739 Peripheral vascular disease, unspecified: Secondary | ICD-10-CM

## 2016-07-16 DIAGNOSIS — I6529 Occlusion and stenosis of unspecified carotid artery: Secondary | ICD-10-CM | POA: Insufficient documentation

## 2016-07-16 DIAGNOSIS — I6523 Occlusion and stenosis of bilateral carotid arteries: Secondary | ICD-10-CM

## 2016-07-16 DIAGNOSIS — E1159 Type 2 diabetes mellitus with other circulatory complications: Secondary | ICD-10-CM | POA: Diagnosis not present

## 2016-07-16 DIAGNOSIS — J449 Chronic obstructive pulmonary disease, unspecified: Secondary | ICD-10-CM | POA: Insufficient documentation

## 2016-07-16 DIAGNOSIS — E119 Type 2 diabetes mellitus without complications: Secondary | ICD-10-CM | POA: Insufficient documentation

## 2016-07-16 DIAGNOSIS — Z794 Long term (current) use of insulin: Secondary | ICD-10-CM | POA: Diagnosis not present

## 2016-07-16 DIAGNOSIS — J441 Chronic obstructive pulmonary disease with (acute) exacerbation: Secondary | ICD-10-CM

## 2016-07-16 DIAGNOSIS — I509 Heart failure, unspecified: Secondary | ICD-10-CM

## 2016-07-16 NOTE — Progress Notes (Signed)
MRN : [id number]  Kenneth Massey is a 83 y.o. (Aug 06, 1929) male who presents with chief complaint of  Chief Complaint  Patient presents with  . Follow-up  .  History of Present Illness: The patient is seen for follow up evaluation of carotid stenosis. The carotid stenosis followed by ultrasound.   The patient denies amaurosis fugax. There is no recent history of TIA symptoms or focal motor deficits. There is no prior documented CVA.  The patient is taking enteric-coated aspirin 81 mg daily.  There is no history of migraine headaches. There is no history of seizures.  The patient has a history of coronary artery disease, no recent episodes of angina or shortness of breath. The patient denies PAD or claudication symptoms. There is a history of hyperlipidemia which is being treated with a statin.    Carotid Duplex done today shows 60-79% LICA stenosis and <30% RICA.    Current Meds  Medication Sig  . albuterol (PROVENTIL HFA;VENTOLIN HFA) 108 (90 BASE) MCG/ACT inhaler Inhale 2 puffs into the lungs every 4 (four) hours as needed for wheezing or shortness of breath.  Marland Kitchen aspirin EC 81 MG tablet Take 81 mg by mouth daily.  . carvedilol (COREG) 6.25 MG tablet Take 6.25 mg by mouth 2 (two) times daily with a meal.  . Cholecalciferol (VITAMIN D3) 2000 UNITS capsule Take 2,000 Units by mouth daily.  . feeding supplement, GLUCERNA SHAKE, (GLUCERNA SHAKE) LIQD Take 237 mLs by mouth 2 (two) times daily between meals.  . Fluticasone-Salmeterol (ADVAIR) 250-50 MCG/DOSE AEPB Inhale 1 puff into the lungs 2 (two) times daily.  . furosemide (LASIX) 20 MG tablet Take 20 mg by mouth daily.  Marland Kitchen glimepiride (AMARYL) 4 MG tablet Take 4 mg by mouth 2 (two) times daily.  Marland Kitchen HUMULIN 70/30 (70-30) 100 UNIT/ML injection Inject 25 Units into the skin 2 (two) times daily before a meal.  . Multiple Vitamins-Minerals (PRESERVISION AREDS 2 PO) Take 1 capsule by mouth 2 (two) times daily.   . vitamin E (VITAMIN  E) 1000 UNIT capsule Take 1,000 Units by mouth daily.    Past Medical History:  Diagnosis Date  . CAD (coronary artery disease)   . CKD (chronic kidney disease)   . COPD (chronic obstructive pulmonary disease) (HCC)   . Diabetes mellitus with neuropathy (HCC)   . DM type 2 (diabetes mellitus, type 2) (HCC)   . Hypertension     Past Surgical History:  Procedure Laterality Date  . ABDOMINAL AORTIC ANEURYSM REPAIR    . CHOLECYSTECTOMY    . CORONARY ARTERY BYPASS GRAFT    . RENAL ARTERY STENT      Social History Social History  Substance Use Topics  . Smoking status: Former Games developer  . Smokeless tobacco: Never Used  . Alcohol use No    Family History Family History  Problem Relation Age of Onset  . Hypertension    No family history of bleeding/clotting disorders, porphyria or autoimmune disease   No Known Allergies   REVIEW OF SYSTEMS (Negative unless checked)  Constitutional: [] Weight loss  [] Fever  [] Chills Cardiac: [] Chest pain   [] Chest pressure   [] Palpitations   [] Shortness of breath when laying flat   [] Shortness of breath with exertion. Vascular:  [] Pain in legs with walking   [] Pain in legs at rest  [] History of DVT   [] Phlebitis   [] Swelling in legs   [] Varicose veins   [] Non-healing ulcers Pulmonary:   [] Uses home oxygen   [] Productive  cough   [] Hemoptysis   [] Wheeze  [] COPD   [] Asthma Neurologic:  [] Dizziness   [] Seizures   [] History of stroke   [] History of TIA  [] Aphasia   [] Vissual changes   [] Weakness or numbness in arm   [] Weakness or numbness in leg Musculoskeletal:   [] Joint swelling   [] Joint pain   [] Low back pain Hematologic:  [] Easy bruising  [] Easy bleeding   [] Hypercoagulable state   [] Anemic Gastrointestinal:  [] Diarrhea   [] Vomiting  [] Gastroesophageal reflux/heartburn   [] Difficulty swallowing. Genitourinary:  [] Chronic kidney disease   [] Difficult urination  [] Frequent urination   [] Blood in urine Skin:  [] Rashes   [] Ulcers  Psychological:   [] History of anxiety   []  History of major depression.  Physical Examination  Vitals:   07/16/16 1416 07/16/16 1417  BP: 136/66 136/74  Pulse: 86   Resp: 16   Weight: 161 lb (73 kg)    Body mass index is 21.84 kg/m. Gen: WD/WN, NAD Head: [place]/AT, No temporalis wasting.  Ear/Nose/Throat: Hearing grossly intact, nares w/o erythema or drainage, poor dentition Eyes: PER, EOMI, sclera nonicteric.  Neck: Supple, no masses.  No bruit or JVD.  Pulmonary:  Good air movement, clear to auscultation bilaterally, no use of accessory muscles.  Cardiac: RRR, normal S1, S2, no Murmurs. Vascular:  Left harsh carotid bruit, mild edema bilateral lower extremities Vessel Right Left  Radial Palpable Palpable  Ulnar Palpable Palpable  Brachial Palpable Palpable  Carotid Palpable Palpable  Femoral Palpable Palpable  Popliteal Not Palpable Not Palpable  PT Not Palpable Not Palpable  DP Not Palpable Not Palpable   Gastrointestinal: soft, non-distended. No guarding/no peritoneal signs.  Musculoskeletal: M/S 5/5 throughout.  No deformity or atrophy.  Neurologic: CN 2-12 intact. Pain and light touch intact in extremities.  Symmetrical.  Speech is fluent. Motor exam as listed above. Psychiatric: Judgment intact, Mood & affect appropriate for pt's clinical situation. Dermatologic: No rashes or ulcers noted.  No changes consistent with cellulitis. Lymph : No Cervical lymphadenopathy, no lichenification or skin changes of chronic lymphedema.  CBC Lab Results  Component Value Date   WBC 6.7 05/03/2015   HGB 12.4 (L) 05/03/2015   HCT 37.7 (L) 05/03/2015   MCV 85.7 05/03/2015   PLT 189 05/03/2015    BMET    Component Value Date/Time   NA 137 05/03/2015 1259   NA 139 08/03/2011 0914   K 3.9 05/03/2015 1259   K 4.5 08/03/2011 0914   CL 96 (L) 05/03/2015 1259   CL 103 08/03/2011 0914   CO2 32 05/03/2015 1259   CO2 29 08/03/2011 0914   GLUCOSE 142 (H) 05/03/2015 1259   GLUCOSE 209 (H) 08/03/2011  0914   BUN 36 (H) 05/03/2015 1259   BUN 17 08/03/2011 0914   CREATININE 1.67 (H) 05/03/2015 1259   CREATININE 1.36 (H) 08/03/2011 0914   CALCIUM 8.9 05/03/2015 1259   CALCIUM 9.1 08/03/2011 0914   GFRNONAA 36 (L) 05/03/2015 1259   GFRNONAA 54 (L) 08/03/2011 0914   GFRAA 42 (L) 05/03/2015 1259   GFRAA >60 08/03/2011 0914   CrCl cannot be calculated (Patient's most recent lab result is older than the maximum 21 days allowed.).  COAG Lab Results  Component Value Date   INR 1.1 06/25/2011   INR 1.5 10/19/2008   INR 1.1 10/17/2008    Radiology No results found.  Assessment/Plan 1. Bilateral carotid artery stenosis Recommend:  Given the patient's asymptomatic subcritical stenosis no further invasive testing or surgery at this time.  Duplex ultrasound shows 60-79% LICA stenosis.  Continue antiplatelet therapy as prescribed Continue management of CAD, HTN and Hyperlipidemia Healthy heart diet,  encouraged exercise at least 4 times per week Follow up in 6 months with duplex ultrasound and physical exam based on >50% stenosis of the left carotid artery   - VAS US CAROTID; Future  2. PAD (peripheral artery disease) (HCC)  Recommend:  The patient has evidence of atherosclerosis of the lower extremities with claudication.  The patient does not voice lifestyle limiting changes at this point in time.  Noninvasive studies do not suggest clinically significant change.  No invasive studies, angiography or surgery at this time The patient should continue walking and begin a more formal exercise program.  The patient should continue antiplatelet therapy and aggressive treatment of the lipid abnormalities  No changes in the patient's medications at this time  The patient should continue wearing graduated compression socks 10-15 mmHg strength to control the mild edema.   - VAS [place] ABI WITH/WO TBI; Future  3. Congestive heart failure, unspecified congestive heart failure chronicity,  unspecified congestive heart failure type (HCC) Continue cardiac and antihypertensive medications as already ordered and reviewed, no changes at this time.  Continue statin as ordered and reviewed, no changes at this time  Nitrates PRN for chest pain   4. Type 2 diabetes mellitus with other circulatory complication, with long-term current use of insulin (HCC) Continue hypoglycemic medications as already ordered, these medications have been reviewed and there are no changes at this time.  Hgb A1C to be monitored as already arranged by primary service   5. COPD exacerbation (HCC) Continue pulmonary medications and aerosols as already ordered, these medications have been reviewed and there are no changes at this time.     Levora Dredge, MD  07/16/2016 2:40 PM

## 2016-07-18 ENCOUNTER — Ambulatory Visit
Admission: EM | Admit: 2016-07-18 | Discharge: 2016-07-18 | Disposition: A | Discharge status: Home / Self Care | Payer: Medicare Other | Attending: Family Medicine | Admitting: Family Medicine

## 2016-07-18 DIAGNOSIS — J40 Bronchitis, not specified as acute or chronic: Secondary | ICD-10-CM | POA: Diagnosis not present

## 2016-07-18 DIAGNOSIS — R040 Epistaxis: Secondary | ICD-10-CM

## 2016-07-18 DIAGNOSIS — J069 Acute upper respiratory infection, unspecified: Secondary | ICD-10-CM

## 2016-07-18 MED ORDER — AMOXICILLIN 500 MG PO CAPS: 500.0000 mg | ORAL_CAPSULE | Freq: Three times a day (TID) | ORAL | 0 refills | Status: DC

## 2016-07-18 NOTE — ED Triage Notes (Signed)
Pt reports "I have a head cold". Congestion and laryngitis. Had bloody nose last night. No fever.

## 2016-07-18 NOTE — ED Provider Notes (Signed)
CSN: [id number]     Arrival date & time 07/18/16  1000 History   First MD Initiated Contact with Patient 07/18/16 1046     Chief Complaint  Patient presents with  . URI   (Consider location/radiation/quality/duration/timing/severity/associated sxs/prior Treatment) Pt brought in by wife c/o cough, congestion, sinus pressure, and a nosebleed last night. Denies any fevers. Has chronic cough and congestion denies much of a change. Clear to thick mucous at times. Denies any smoking. Ox always is at 93 and denies feeling any sob. Pt is asking for abx that he has had previously in dec that had helped his same sx. Has not taken anything today. No nose bleeding toay. Denies any headache.       Past Medical History:  Diagnosis Date  . CAD (coronary artery disease)   . CKD (chronic kidney disease)   . COPD (chronic obstructive pulmonary disease) (HCC)   . Diabetes mellitus with neuropathy (HCC)   . DM type 2 (diabetes mellitus, type 2) (HCC)   . Hypertension    Past Surgical History:  Procedure Laterality Date  . ABDOMINAL AORTIC ANEURYSM REPAIR    . CHOLECYSTECTOMY    . CORONARY ARTERY BYPASS GRAFT    . RENAL ARTERY STENT     Family History  Problem Relation Age of Onset  . Hypertension     Social History  Substance Use Topics  . Smoking status: Former Games developermoker  . Smokeless tobacco: Never Used  . Alcohol use No    Review of Systems  Constitutional: Negative.   HENT: Positive for congestion, nosebleeds, postnasal drip, rhinorrhea, sinus pain and sinus pressure.   Eyes: Negative.   Respiratory: Positive for cough.   Cardiovascular: Negative.   Gastrointestinal: Negative.   Genitourinary: Negative.   Musculoskeletal: Negative.   Neurological: Negative.     Allergies  Patient has no known allergies.  Home Medications   Prior to Admission medications   Medication Sig Start Date End Date Taking? Authorizing Provider  albuterol (PROVENTIL HFA;VENTOLIN HFA) 108 (90 BASE)  MCG/ACT inhaler Inhale 2 puffs into the lungs every 4 (four) hours as needed for wheezing or shortness of breath. 09/24/14   Hassan RowanEugene Wade, MD  amoxicillin (AMOXIL) 500 MG capsule Take 1 capsule (500 mg total) by mouth 3 (three) times daily. 07/18/16   Tobi BastosMelanie A Mitchell, NP  aspirin EC 81 MG tablet Take 81 mg by mouth daily.    Historical Provider, MD  carvedilol (COREG) 6.25 MG tablet Take 6.25 mg by mouth 2 (two) times daily with a meal.    Historical Provider, MD  Cholecalciferol (VITAMIN D3) 2000 UNITS capsule Take 2,000 Units by mouth daily.    Historical Provider, MD  feeding supplement, GLUCERNA SHAKE, (GLUCERNA SHAKE) LIQD Take 237 mLs by mouth 2 (two) times daily between meals. 04/25/15   Adrian SaranSital Mody, MD  Fluticasone-Salmeterol (ADVAIR) 250-50 MCG/DOSE AEPB Inhale 1 puff into the lungs 2 (two) times daily.    Historical Provider, MD  furosemide (LASIX) 20 MG tablet Take 20 mg by mouth daily.    Historical Provider, MD  glimepiride (AMARYL) 4 MG tablet Take 4 mg by mouth 2 (two) times daily.    Historical Provider, MD  HUMULIN 70/30 (70-30) 100 UNIT/ML injection Inject 25 Units into the skin 2 (two) times daily before a meal. 04/16/15   Gale Journeyatherine P Walsh, MD  Multiple Vitamins-Minerals (PRESERVISION AREDS 2 PO) Take 1 capsule by mouth 2 (two) times daily.     Historical Provider, MD  vitamin E (  VITAMIN E) 1000 UNIT capsule Take 1,000 Units by mouth daily.    Historical Provider, MD   Meds Ordered and Administered this Visit  Medications - No data to display  BP 123/63 (BP Location: Left Arm)   Pulse 83   Temp 97.6 F (36.4 C) (Oral)   Resp 16   Ht 6\' 1"  (1.854 m)   Wt 161 lb (73 kg)   SpO2 93% Comment: reports this is his normal with hx of emphysema  BMI 21.24 kg/m  No data found.   Physical Exam  Constitutional: He appears well-developed.  HENT:  Head: Normocephalic.  Right Ear: External ear normal.  Left Ear: External ear normal.  Mouth/Throat: Oropharynx is clear and moist.   Clear post nasal drip , dried blood to rt inner nare,   Eyes: Pupils are equal, round, and reactive to light.  Neck: Normal range of motion.  Cardiovascular: Normal rate.   Pulmonary/Chest: Effort normal and breath sounds normal.  Talking in full sentences, no distress   Abdominal: Soft. Bowel sounds are normal.  Neurological: He is alert.  Skin: Skin is warm. Capillary refill takes less than 2 seconds.    Urgent Care Course     Procedures (including critical care time)  Labs Review Labs Reviewed - No data to display  Imaging Review No results found.           MDM   1. Bronchitis    Follow up with your pcp or MD flemings this week  If symptoms get worse go to the ER. Reviewed previous charts and medical hx.  Ox sats maintain at 93% Stay hydrated May use a humidifier     Tobi Bastos, NP 07/18/16 1108

## 2016-07-18 NOTE — Discharge Instructions (Signed)
Follow up with your pcp or MD flemings this week  If symptoms get worse go to the ER.

## 2017-01-18 ENCOUNTER — Encounter (INDEPENDENT_AMBULATORY_CARE_PROVIDER_SITE_OTHER): Payer: Self-pay | Admitting: Vascular Surgery

## 2017-01-18 ENCOUNTER — Ambulatory Visit (INDEPENDENT_AMBULATORY_CARE_PROVIDER_SITE_OTHER): Payer: Medicare Other

## 2017-01-18 ENCOUNTER — Ambulatory Visit (INDEPENDENT_AMBULATORY_CARE_PROVIDER_SITE_OTHER): Payer: Medicare Other | Admitting: Vascular Surgery

## 2017-01-18 VITALS — BP 118/65 | HR 75 | Resp 14 | Ht 72.0 in | Wt 164.0 lb

## 2017-01-18 DIAGNOSIS — Z794 Long term (current) use of insulin: Secondary | ICD-10-CM

## 2017-01-18 DIAGNOSIS — I739 Peripheral vascular disease, unspecified: Secondary | ICD-10-CM

## 2017-01-18 DIAGNOSIS — I509 Heart failure, unspecified: Secondary | ICD-10-CM | POA: Diagnosis not present

## 2017-01-18 DIAGNOSIS — I6523 Occlusion and stenosis of bilateral carotid arteries: Secondary | ICD-10-CM

## 2017-01-18 DIAGNOSIS — E1159 Type 2 diabetes mellitus with other circulatory complications: Secondary | ICD-10-CM | POA: Diagnosis not present

## 2017-01-20 NOTE — Progress Notes (Signed)
MRN : [id number]  Kenneth Massey is a 83 y.o. (07/14/1929) male who presents with chief complaint of  Chief Complaint  Patient presents with  . Follow-up    6 month f/u ABI and carotid  .  History of Present Illness: The patient is seen for follow up evaluation of carotid stenosis. The carotid stenosis followed by ultrasound.   The patient denies amaurosis fugax. There is no recent history of TIA symptoms or focal motor deficits. There is no prior documented CVA.  The patient is taking enteric-coated aspirin 81 mg daily.  There is no history of migraine headaches. There is no history of seizures.  The patient has a history of coronary artery disease, no recent episodes of angina or shortness of breath. The patient denies PAD or claudication symptoms. There is a history of hyperlipidemia which is being treated with a statin.    Carotid Duplex done today shows <40% RICA and >80% LICA.    ABI's Rt=0.93 and Lt=0.81  (no significant change)  Current Meds  Medication Sig  . albuterol (PROVENTIL HFA;VENTOLIN HFA) 108 (90 BASE) MCG/ACT inhaler Inhale 2 puffs into the lungs every 4 (four) hours as needed for wheezing or shortness of breath.  Marland Kitchen amoxicillin (AMOXIL) 500 MG capsule Take 1 capsule (500 mg total) by mouth 3 (three) times daily.  Marland Kitchen aspirin EC 81 MG tablet Take 81 mg by mouth daily.  . carvedilol (COREG) 6.25 MG tablet Take 6.25 mg by mouth 2 (two) times daily with a meal.  . Cholecalciferol (VITAMIN D3) 2000 UNITS capsule Take 2,000 Units by mouth daily.  . feeding supplement, GLUCERNA SHAKE, (GLUCERNA SHAKE) LIQD Take 237 mLs by mouth 2 (two) times daily between meals.  . Fluticasone-Salmeterol (ADVAIR) 250-50 MCG/DOSE AEPB Inhale 1 puff into the lungs 2 (two) times daily.  . furosemide (LASIX) 20 MG tablet Take 20 mg by mouth daily.  Marland Kitchen glimepiride (AMARYL) 4 MG tablet Take 4 mg by mouth 2 (two) times daily.  Marland Kitchen HUMULIN 70/30 (70-30) 100 UNIT/ML injection Inject 25 Units  into the skin 2 (two) times daily before a meal.  . metolazone (ZAROXOLYN) 5 MG tablet TAKE 1 TABLET(5 MG) BY MOUTH EVERY DAY  . Multiple Vitamins-Minerals (PRESERVISION AREDS 2 PO) Take 1 capsule by mouth 2 (two) times daily.   Marland Kitchen PREVNAR 13 SUSP injection   . TESTOSTERONE TD Use. 5% cream  . torsemide (DEMADEX) 20 MG tablet TAKE 1 TABLET(20 MG) BY MOUTH EVERY DAY  . vitamin E (VITAMIN E) 1000 UNIT capsule Take 1,000 Units by mouth daily.    Past Medical History:  Diagnosis Date  . CAD (coronary artery disease)   . CKD (chronic kidney disease)   . COPD (chronic obstructive pulmonary disease) (HCC)   . Diabetes mellitus with neuropathy (HCC)   . DM type 2 (diabetes mellitus, type 2) (HCC)   . Hypertension     Past Surgical History:  Procedure Laterality Date  . ABDOMINAL AORTIC ANEURYSM REPAIR    . CHOLECYSTECTOMY    . CORONARY ARTERY BYPASS GRAFT    . RENAL ARTERY STENT      Social History Social History  Substance Use Topics  . Smoking status: Former Games developer  . Smokeless tobacco: Never Used  . Alcohol use No    Family History Family History  Problem Relation Age of Onset  . Hypertension Unknown     No Known Allergies   REVIEW OF SYSTEMS (Negative unless checked)  Constitutional: Weight loss  Fever    Chills Cardiac: Chest pain   Chest pressure   Palpitations   Shortness of breath when laying flat   Shortness of breath with exertion. Vascular:  Pain in legs with walking   Pain in legs at rest  History of DVT   Phlebitis   Swelling in legs   Varicose veins   Non-healing ulcers Pulmonary:   Uses home oxygen   Productive cough   Hemoptysis   Wheeze  COPD   Asthma Neurologic:  Dizziness   Seizures   History of stroke   History of TIA  Aphasia   Vissual changes   Weakness or numbness in arm   Weakness or numbness in leg Musculoskeletal:   Joint swelling   Joint pain   Low back pain Hematologic:  Easy  bruising  Easy bleeding   Hypercoagulable state   Anemic Gastrointestinal:  Diarrhea   Vomiting  Gastroesophageal reflux/heartburn   Difficulty swallowing. Genitourinary:  Chronic kidney disease   Difficult urination  Frequent urination   Blood in urine Skin:  Rashes   Ulcers  Psychological:  History of anxiety    History of major depression.  Physical Examination  Vitals:   01/18/17 1436 01/18/17 1437  BP: 133/62 118/65  Pulse: 73 75  Resp: 14   Weight: 74.4 kg (164 lb)   Height: 6' (1.829 m)    Body mass index is 22.24 kg/m. Gen: WD/WN, NAD Head: [place]/AT, No temporalis wasting.  Ear/Nose/Throat: Hearing grossly intact, nares w/o erythema or drainage Eyes: PER, EOMI, sclera nonicteric.  Neck: Supple, no large masses.   Pulmonary:  Good air movement, no audible wheezing bilaterally, no use of accessory muscles.  Cardiac: RRR, no JVD Vascular:  Bilateral carotid bruits noted Vessel Right Left  Radial Palpable Palpable  Popliteal Not Palpable Not Palpable  PT Not Palpable Not Palpable  DP Not Palpable Not Palpable  Gastrointestinal: Non-distended. No guarding/no peritoneal signs.  Musculoskeletal: M/S 5/5 throughout.  No deformity or atrophy.  Neurologic: CN 2-12 intact. Symmetrical.  Speech is fluent. Motor exam as listed above. Psychiatric: Judgment intact, Mood & affect appropriate for pt's clinical situation. Dermatologic: No rashes or ulcers noted.  No changes consistent with cellulitis. Lymph : No lichenification or skin changes of chronic lymphedema.  CBC Lab Results  Component Value Date   WBC 6.7 05/03/2015   HGB 12.4 (L) 05/03/2015   HCT 37.7 (L) 05/03/2015   MCV 85.7 05/03/2015   PLT 189 05/03/2015    BMET    Component Value Date/Time   NA 137 05/03/2015 1259   NA 139 08/03/2011 0914   K 3.9 05/03/2015 1259   K 4.5 08/03/2011 0914   CL 96 (L) 05/03/2015 1259   CL 103 08/03/2011 0914   CO2 32 05/03/2015 1259   CO2 29  08/03/2011 0914   GLUCOSE 142 (H) 05/03/2015 1259   GLUCOSE 209 (H) 08/03/2011 0914   BUN 36 (H) 05/03/2015 1259   BUN 17 08/03/2011 0914   CREATININE 1.67 (H) 05/03/2015 1259   CREATININE 1.36 (H) 08/03/2011 0914   CALCIUM 8.9 05/03/2015 1259   CALCIUM 9.1 08/03/2011 0914   GFRNONAA 36 (L) 05/03/2015 1259   GFRNONAA 54 (L) 08/03/2011 0914   GFRAA 42 (L) 05/03/2015 1259   GFRAA >60 08/03/2011 0914   CrCl cannot be calculated (Patient's most recent lab result is older than the maximum 21 days allowed.).  COAG Lab Results  Component Value Date   INR 1.1 06/25/2011   INR 1.5 10/19/2008   INR  1.1 10/17/2008    Radiology No results found.   Assessment/Plan 1. Bilateral carotid artery stenosis The patient remains asymptomatic with respect to the carotid stenosis.  However, the patient has now progressed and has a lesion the is >70%.  Patient should undergo CT angiography of the carotid arteries to define the degree of stenosis of the internal carotid arteries bilaterally and the anatomic suitability for surgery vs. intervention.  If the patient does indeed need surgery cardiac clearance will be required, once cleared the patient will be scheduled for surgery.  I have spoken to Dr Darrold Junker and he will contact the patient for an appointment.  The risks, benefits and alternative therapies were reviewed in detail with the patient.  All questions were answered.  The patient agrees to proceed with imaging.  Continue antiplatelet therapy as prescribed. Continue management of CAD, HTN and Hyperlipidemia. Healthy heart diet, encouraged exercise at least 4 times per week.   - CT ANGIO NECK W OR WO CONTRAST; Future  2. PAD (peripheral artery disease) (HCC)  Recommend:  The patient has evidence of atherosclerosis of the lower extremities with claudication.  The patient does not voice lifestyle limiting changes at this point in time.  Noninvasive studies do not suggest clinically  significant change.  No invasive studies, angiography or surgery at this time The patient should continue walking and begin a more formal exercise program.  The patient should continue antiplatelet therapy and aggressive treatment of the lipid abnormalities  No changes in the patient's medications at this time  The patient should continue wearing graduated compression socks 10-15 mmHg strength to control the mild edema.    3. Congestive heart failure, unspecified HF chronicity, unspecified heart failure type (HCC) Continue cardiac and antihypertensive medications as already ordered and reviewed, no changes at this time.  Continue statin as ordered and reviewed, no changes at this time  Nitrates PRN for chest pain   4. Type 2 diabetes mellitus with other circulatory complication, with long-term current use of insulin (HCC) Continue hypoglycemic medications as already ordered, these medications have been reviewed and there are no changes at this time.  Hgb A1C to be monitored as already arranged by primary service     Levora Dredge, MD  01/20/2017 8:16 AM

## 2017-01-25 ENCOUNTER — Other Ambulatory Visit (INDEPENDENT_AMBULATORY_CARE_PROVIDER_SITE_OTHER): Payer: Self-pay | Admitting: Vascular Surgery

## 2017-01-25 ENCOUNTER — Telehealth (INDEPENDENT_AMBULATORY_CARE_PROVIDER_SITE_OTHER): Payer: Self-pay | Admitting: Vascular Surgery

## 2017-01-25 DIAGNOSIS — I771 Stricture of artery: Secondary | ICD-10-CM

## 2017-01-25 NOTE — Telephone Encounter (Signed)
Called patients wife to ask her if she still wanted the [place] location for the CTA, she stated yes if we could make that happen. Dr. Gilda Crease would also like a clearance to be sent over to Dr. Cassie Freer office, I faxed the clearance today. The patients wife will call back once she hears from the imaging center to set up Kenneth Massey's follow up

## 2017-01-25 NOTE — Telephone Encounter (Signed)
I placed the order for a CTA Neck at [facility] as per patient preference. The patient should call the office when it is completed to schedule an appointment with Dr. Gilda Crease to review the results. Thanks!

## 2017-01-25 NOTE — Telephone Encounter (Signed)
Dr. Gilda Crease would like the patient to undergo a CTA of the neck. Can we please find out if that was completed. He will also need clearance by Dr. Cassie Freer (cardiology) office.

## 2017-01-25 NOTE — Telephone Encounter (Signed)
MRS Badal CALLED SHE IS WANTING KNOW WHAT DR SCHNIER EVER DECIDED TO DO ABOUT HER HUSBAND'S BUILD UP OF [place]? SHE SAID DR SCHNIER TALKED ABOUT DOING A PROCEDURE. PLEASE LET HER KNOW WHAT HE WANTS TO DO

## 2017-01-29 ENCOUNTER — Other Ambulatory Visit (INDEPENDENT_AMBULATORY_CARE_PROVIDER_SITE_OTHER): Payer: Self-pay | Admitting: Vascular Surgery

## 2017-01-29 ENCOUNTER — Ambulatory Visit
Admission: RE | Admit: 2017-01-29 | Discharge: 2017-01-29 | Disposition: A | Discharge status: Home / Self Care | Payer: Medicare Other | Source: Ambulatory Visit | Attending: Vascular Surgery | Admitting: Vascular Surgery

## 2017-01-29 DIAGNOSIS — I6523 Occlusion and stenosis of bilateral carotid arteries: Secondary | ICD-10-CM

## 2017-01-29 DIAGNOSIS — I7090 Unspecified atherosclerosis: Secondary | ICD-10-CM | POA: Diagnosis not present

## 2017-01-29 DIAGNOSIS — J432 Centrilobular emphysema: Secondary | ICD-10-CM | POA: Insufficient documentation

## 2017-01-29 LAB — POCT I-STAT CREATININE: CREATININE: 2.2 mg/dL — AB (ref 0.61–1.24)

## 2017-02-01 ENCOUNTER — Ambulatory Visit: Payer: Medicare Other

## 2017-02-04 ENCOUNTER — Ambulatory Visit (INDEPENDENT_AMBULATORY_CARE_PROVIDER_SITE_OTHER): Payer: Medicare Other | Admitting: Vascular Surgery

## 2017-02-04 ENCOUNTER — Encounter (INDEPENDENT_AMBULATORY_CARE_PROVIDER_SITE_OTHER): Payer: Self-pay | Admitting: Vascular Surgery

## 2017-02-04 VITALS — BP 135/75 | HR 77 | Resp 16 | Wt 164.0 lb

## 2017-02-04 DIAGNOSIS — Z794 Long term (current) use of insulin: Secondary | ICD-10-CM

## 2017-02-04 DIAGNOSIS — I509 Heart failure, unspecified: Secondary | ICD-10-CM

## 2017-02-04 DIAGNOSIS — E1159 Type 2 diabetes mellitus with other circulatory complications: Secondary | ICD-10-CM | POA: Diagnosis not present

## 2017-02-04 DIAGNOSIS — J439 Emphysema, unspecified: Secondary | ICD-10-CM

## 2017-02-04 DIAGNOSIS — I6523 Occlusion and stenosis of bilateral carotid arteries: Secondary | ICD-10-CM

## 2017-02-05 ENCOUNTER — Inpatient Hospital Stay: Admission: RE | Admit: 2017-02-05 | Payer: Medicare Other | Source: Ambulatory Visit

## 2017-02-06 NOTE — Progress Notes (Signed)
MRN : [id number]  Kenneth Massey is a 83 y.o. (15-Jan-1930) male who presents with chief complaint of  Chief Complaint  Patient presents with  . Follow-up    ct results  .  History of Present Illness: The patient is seen for follow up evaluation of carotid stenosis status post CT angiogram. CT scan was done without contrast because of his baseline renal function. Patient reports that the test went well with no problems or complications.   The patient denies interval amaurosis fugax. There is no recent or interval TIA symptoms or focal motor deficits. There is no prior documented CVA.  The patient is taking enteric-coated aspirin 81 mg daily.  There is no history of migraine headaches. There is no history of seizures.  The patient has a history of coronary artery disease, no recent episodes of angina or shortness of breath. The patient denies PAD or claudication symptoms. There is a history of hyperlipidemia which is being treated with a statin.    CT angiogram is reviewed by me personally and shows a densely moderately calcified stenosis located at C4.   Current Meds  Medication Sig  . albuterol (PROVENTIL HFA;VENTOLIN HFA) 108 (90 BASE) MCG/ACT inhaler Inhale 2 puffs into the lungs every 4 (four) hours as needed for wheezing or shortness of breath.  Marland Kitchen aspirin EC 81 MG tablet Take 81 mg by mouth daily.  . carvedilol (COREG) 6.25 MG tablet Take 6.25 mg by mouth 2 (two) times daily with a meal.  . Cholecalciferol (VITAMIN D3) 2000 UNITS capsule Take 2,000 Units by mouth daily.  . feeding supplement, GLUCERNA SHAKE, (GLUCERNA SHAKE) LIQD Take 237 mLs by mouth 2 (two) times daily between meals.  . Fluticasone-Salmeterol (ADVAIR) 250-50 MCG/DOSE AEPB Inhale 1 puff into the lungs 2 (two) times daily.  Marland Kitchen glimepiride (AMARYL) 4 MG tablet Take 4 mg by mouth 2 (two) times daily.  Marland Kitchen HUMULIN 70/30 (70-30) 100 UNIT/ML injection Inject 25 Units into the skin 2 (two) times daily before a  meal.  . insulin NPH-regular Human (NOVOLIN 70/30 RELION) (70-30) 100 UNIT/ML injection Inject 5 Units into the skin 2 (two) times daily with a meal.  . metolazone (ZAROXOLYN) 5 MG tablet TAKE 1 TABLET(5 MG) BY MOUTH EVERY DAY  . Multiple Vitamins-Minerals (PRESERVISION AREDS 2 PO) Take 1 capsule by mouth 2 (two) times daily.   . TESTOSTERONE TD Apply topically once daily. 5% cream  . torsemide (DEMADEX) 20 MG tablet TAKE 1 TABLET(20 MG) BY MOUTH EVERY DAY  . vitamin E (VITAMIN E) 1000 UNIT capsule Take 1,000 Units by mouth daily.    Past Medical History:  Diagnosis Date  . CAD (coronary artery disease)   . CKD (chronic kidney disease)   . COPD (chronic obstructive pulmonary disease) (HCC)   . Diabetes mellitus with neuropathy (HCC)   . DM type 2 (diabetes mellitus, type 2) (HCC)   . Hypertension     Past Surgical History:  Procedure Laterality Date  . ABDOMINAL AORTIC ANEURYSM REPAIR    . CHOLECYSTECTOMY    . CORONARY ARTERY BYPASS GRAFT    . RENAL ARTERY STENT      Social History Social History  Substance Use Topics  . Smoking status: Former Games developer  . Smokeless tobacco: Never Used  . Alcohol use No    Family History Family History  Problem Relation Age of Onset  . Hypertension Unknown     No Known Allergies   REVIEW OF SYSTEMS (Negative unless checked)  Constitutional: Weight loss  Fever  Chills Cardiac: Chest pain   Chest pressure   Palpitations   Shortness of breath when laying flat   Shortness of breath with exertion. Vascular:  Pain in legs with walking   Pain in legs at rest  History of DVT   Phlebitis   Swelling in legs   Varicose veins   Non-healing ulcers Pulmonary:   Uses home oxygen   Productive cough   Hemoptysis   Wheeze  COPD   Asthma Neurologic:  Dizziness   Seizures   History of stroke   History of TIA  Aphasia   Vissual changes   Weakness or numbness in arm   Weakness or numbness in  leg Musculoskeletal:   Joint swelling   Joint pain   Low back pain Hematologic:  Easy bruising  Easy bleeding   Hypercoagulable state   Anemic Gastrointestinal:  Diarrhea   Vomiting  Gastroesophageal reflux/heartburn   Difficulty swallowing. Genitourinary:  Chronic kidney disease   Difficult urination  Frequent urination   Blood in urine Skin:  Rashes   Ulcers  Psychological:  History of anxiety    History of major depression.  Physical Examination  Vitals:   02/04/17 1049  BP: 135/75  Pulse: 77  Resp: 16  Weight: 164 lb (74.4 kg)   Body mass index is 22.24 kg/m. Gen: WD/WN, NAD Head: [place]/AT, No temporalis wasting.  Ear/Nose/Throat: Hearing grossly intact, nares w/o erythema or drainage Eyes: PER, EOMI, sclera nonicteric.  Neck: Supple, no large masses.   Pulmonary:  Good air movement, no audible wheezing bilaterally, no use of accessory muscles.  Cardiac: RRR, no JVD Vascular: bilateral carotid bruit Vessel Right Left  Radial Palpable Palpable  Ulnar Palpable Palpable  Brachial Palpable Palpable  Carotid Palpable Palpable  Gastrointestinal: Non-distended. No guarding/no peritoneal signs.  Musculoskeletal: M/S 5/5 throughout.  No deformity or atrophy.  Neurologic: CN 2-12 intact. Symmetrical.  Speech is fluent. Motor exam as listed above. Psychiatric: Judgment intact, Mood & affect appropriate for pt's clinical situation. Dermatologic: No rashes or ulcers noted.  No changes consistent with cellulitis. Lymph : No lichenification or skin changes of chronic lymphedema.  CBC Lab Results  Component Value Date   WBC 6.7 05/03/2015   HGB 12.4 (L) 05/03/2015   HCT 37.7 (L) 05/03/2015   MCV 85.7 05/03/2015   PLT 189 05/03/2015    BMET    Component Value Date/Time   NA 137 05/03/2015 1259   NA 139 08/03/2011 0914   K 3.9 05/03/2015 1259   K 4.5 08/03/2011 0914   CL 96 (L) 05/03/2015 1259   CL 103 08/03/2011 0914   CO2 32 05/03/2015  1259   CO2 29 08/03/2011 0914   GLUCOSE 142 (H) 05/03/2015 1259   GLUCOSE 209 (H) 08/03/2011 0914   BUN 36 (H) 05/03/2015 1259   BUN 17 08/03/2011 0914   CREATININE 2.20 (H) 01/29/2017 1414   CREATININE 1.36 (H) 08/03/2011 0914   CALCIUM 8.9 05/03/2015 1259   CALCIUM 9.1 08/03/2011 0914   GFRNONAA 36 (L) 05/03/2015 1259   GFRNONAA 54 (L) 08/03/2011 0914   GFRAA 42 (L) 05/03/2015 1259   GFRAA >60 08/03/2011 0914   Estimated Creatinine Clearance: 26.3 mL/min (A) (by C-G formula based on SCr of 2.2 mg/dL (H)).  COAG Lab Results  Component Value Date   INR 1.1 06/25/2011   INR 1.5 10/19/2008   INR 1.1 10/17/2008    Radiology Ct Soft Tissue Neck Wo Contrast  Result Date:  01/29/2017 CLINICAL DATA:  Bilateral carotid artery stenosis. CTA was initially ordered but change to noncontrast study by ordering team due to GRF depression. EXAM: CT NECK WITHOUT CONTRAST TECHNIQUE: Multidetector CT imaging of the neck was performed following the standard protocol without intravenous contrast. COMPARISON:  None. FINDINGS: Pharynx and larynx: No asymmetry or swelling. Salivary glands: Negative Thyroid: Small gland without nodularity. Lymph nodes: No suspicious enlargement or abnormal density. Vascular: Diffuse atherosclerotic calcification with plaque seen throughout the subclavian arteries and bilateral carotid systems. Moderate to bulky plaque at both cervical carotid bifurcations. Limited intracranial: Negative Visualized orbits: Bilateral cataract resection. No pathologic finding. Mastoids and visualized paranasal sinuses: Clear Skeleton: Cervical disc and facet degeneration. No acute or aggressive finding. Upper chest: Centrilobular emphysema. IMPRESSION: 1. Aborted CTA neck due to GFR. Diffuse atherosclerotic calcification. 2. No significant incidental finding in the neck soft tissues. 3. Centrilobular emphysema. Electronically Signed   By: Marnee Spring M.D.   On: 01/29/2017 14:56     Assessment/Plan 1. Bilateral carotid artery stenosis Recommend:  The patient is symptomatic with respect to the carotid stenosis.  The patient now has progressed and has a lesion the is >70%.  Patient's CT angiography of the carotid arteries confirms moderste calcification at the  ICA.  The anatomical considerations support stenting and surgery.  This was discussed in detail with the patient.  The patient is scheduled to see Dr Darrold Junker and that will decide whether surgery or stenting is best.  The risks, benefits and alternative therapies were reviewed in detail with the patient.  All questions were answered.  The patient agrees to proceed with treatment of the left internal carotid artery.  Continue antiplatelet therapy as prescribed. Continue management of CAD, HTN and Hyperlipidemia. Healthy heart diet, encouraged exercise at least 4 times per week.   2. Congestive heart failure, unspecified HF chronicity, unspecified heart failure type (HCC) Continue cardiac and antihypertensive medications as already ordered and reviewed, no changes at this time.  Continue statin as ordered and reviewed, no changes at this time  Nitrates PRN for chest pain   3. Type 2 diabetes mellitus with other circulatory complication, with long-term current use of insulin (HCC) Continue hypoglycemic medications as already ordered, these medications have been reviewed and there are no changes at this time.  Hgb A1C to be monitored as already arranged by primary service   4. Pulmonary emphysema, unspecified emphysema type (HCC) Continue pulmonary medications and aerosols as already ordered, these medications have been reviewed and there are no changes at this time.     Levora Dredge, MD  02/06/2017 3:26 PM

## 2017-02-08 ENCOUNTER — Other Ambulatory Visit (INDEPENDENT_AMBULATORY_CARE_PROVIDER_SITE_OTHER): Payer: Self-pay | Admitting: Vascular Surgery

## 2017-02-08 ENCOUNTER — Encounter (INDEPENDENT_AMBULATORY_CARE_PROVIDER_SITE_OTHER): Payer: Self-pay

## 2017-02-12 ENCOUNTER — Inpatient Hospital Stay: Admission: RE | Admit: 2017-02-12 | Payer: Medicare Other | Source: Ambulatory Visit | Admitting: Vascular Surgery

## 2017-02-12 ENCOUNTER — Encounter: Admission: RE | Payer: Self-pay | Source: Ambulatory Visit

## 2017-02-12 SURGERY — ENDARTERECTOMY, CAROTID
Anesthesia: General | Laterality: Left

## 2017-02-15 ENCOUNTER — Encounter
Admission: RE | Admit: 2017-02-15 | Discharge: 2017-02-15 | Disposition: A | Discharge status: Home / Self Care | Payer: Medicare Other | Source: Ambulatory Visit | Attending: Vascular Surgery | Admitting: Vascular Surgery

## 2017-02-15 DIAGNOSIS — I6529 Occlusion and stenosis of unspecified carotid artery: Secondary | ICD-10-CM | POA: Diagnosis not present

## 2017-02-15 DIAGNOSIS — Z01818 Encounter for other preprocedural examination: Secondary | ICD-10-CM | POA: Insufficient documentation

## 2017-02-15 DIAGNOSIS — I10 Essential (primary) hypertension: Secondary | ICD-10-CM | POA: Diagnosis not present

## 2017-02-15 DIAGNOSIS — Z0183 Encounter for blood typing: Secondary | ICD-10-CM | POA: Insufficient documentation

## 2017-02-15 DIAGNOSIS — E119 Type 2 diabetes mellitus without complications: Secondary | ICD-10-CM | POA: Insufficient documentation

## 2017-02-15 DIAGNOSIS — R9431 Abnormal electrocardiogram [ECG] [EKG]: Secondary | ICD-10-CM | POA: Insufficient documentation

## 2017-02-15 DIAGNOSIS — Z01812 Encounter for preprocedural laboratory examination: Secondary | ICD-10-CM | POA: Insufficient documentation

## 2017-02-15 LAB — CBC WITH DIFFERENTIAL/PLATELET
BASOS ABS: 0 10*3/uL (ref 0–0.1)
BASOS PCT: 0 %
Eosinophils Absolute: 0.2 10*3/uL (ref 0–0.7)
Eosinophils Relative: 3 %
HEMATOCRIT: 49.2 % (ref 40.0–52.0)
Hemoglobin: 16.2 g/dL (ref 13.0–18.0)
Lymphocytes Relative: 19 %
Lymphs Abs: 1.3 10*3/uL (ref 1.0–3.6)
MCH: 30.1 pg (ref 26.0–34.0)
MCHC: 32.9 g/dL (ref 32.0–36.0)
MCV: 91.5 fL (ref 80.0–100.0)
MONO ABS: 0.8 10*3/uL (ref 0.2–1.0)
Monocytes Relative: 12 %
NEUTROS ABS: 4.7 10*3/uL (ref 1.4–6.5)
Neutrophils Relative %: 66 %
PLATELETS: 160 10*3/uL (ref 150–440)
RBC: 5.38 MIL/uL (ref 4.40–5.90)
RDW: 15.7 % — AB (ref 11.5–14.5)
WBC: 7.1 10*3/uL (ref 3.8–10.6)

## 2017-02-15 LAB — BASIC METABOLIC PANEL
ANION GAP: 11 (ref 5–15)
BUN: 42 mg/dL — ABNORMAL HIGH (ref 6–20)
CALCIUM: 9.5 mg/dL (ref 8.9–10.3)
CO2: 30 mmol/L (ref 22–32)
Chloride: 98 mmol/L — ABNORMAL LOW (ref 101–111)
Creatinine, Ser: 1.98 mg/dL — ABNORMAL HIGH (ref 0.61–1.24)
GFR, EST AFRICAN AMERICAN: 34 mL/min — AB (ref >60)
GFR, EST NON AFRICAN AMERICAN: 29 mL/min — AB (ref >60)
GLUCOSE: 232 mg/dL — AB (ref 65–99)
Potassium: 3.5 mmol/L (ref 3.5–5.1)
SODIUM: 139 mmol/L (ref 135–145)

## 2017-02-15 LAB — PROTIME-INR
INR: 1.04
Prothrombin Time: 13.5 seconds (ref 11.4–15.2)

## 2017-02-15 LAB — SURGICAL PCR SCREEN
MRSA, PCR: NEGATIVE
STAPHYLOCOCCUS AUREUS: POSITIVE — AB

## 2017-02-15 LAB — TYPE AND SCREEN
ABO/RH(D): O NEG
Antibody Screen: NEGATIVE

## 2017-02-15 LAB — APTT: APTT: 34 s (ref 24–36)

## 2017-02-15 NOTE — Pre-Procedure Instructions (Signed)
DR PARASCHOS HAS CLEARED PATIENT. FAXED EKG TO HAVE HIM ADDRESS

## 2017-02-15 NOTE — Patient Instructions (Signed)
Your procedure is scheduled on: Friday Nov 30, 2016 Report to DAY SURGERY. 2ND FLOOR MEDICAL MALL ENTRANCE. To find out your arrival time please call [phone] between 1PM - 3PM on Thursday 02/18/17.  Remember: Instructions that are not followed completely may result in serious medical risk, up to and including death, or upon the discretion of your surgeon and anesthesiologist your surgery may need to be rescheduled.    __X__ 1. Do not eat anything after midnight the night before your    procedure.  No gum chewing or hard candies.  You may drink clear   liquids up to 2 hours before you are scheduled to arrive at the   hospital for your procedure. Do not drink clear liquids within 2   hours of scheduled arrival to the hospital as this may lead to your   procedure being delayed or rescheduled.       Clear liquids include:   Water or Apple juice without pulp   Clear carbohydrate beverage such as Clearfast or Gatorade   Black coffee or Clear Tea (no milk, no creamer, do not add anything   to the coffee or tea)    Diabetics should only drink water   __X__ 2. No Alcohol for 24 hours before or after surgery.   ____ 3. Bring all medications with you on the day of surgery if instructed.    __X__ 4. Notify your doctor if there is any change in your medical condition     (cold, fever, infections).             __X___5. No smoking within 24 hours of your surgery.     Do not wear jewelry, make-up, hairpins, clips or nail polish.  Do not wear lotions, powders, or perfumes.   Do not shave 48 hours prior to surgery. Men may shave face and neck.  Do not bring valuables to the hospital.    [facility] is not responsible for any belongings or valuables.               Contacts, dentures or bridgework may not be worn into surgery.  Leave your suitcase in the car. After surgery it may be brought to your room.  For patients admitted to the hospital, discharge time is determined by your                 treatment team.   Patients discharged the day of surgery will not be allowed to drive home.   Please read over the following fact sheets that you were given:   MRSA Information   __X__ Take these medicines the morning of surgery with A SIP OF WATER:    1. CARVEDILOL  2. ADVAIR  3.   4.  5.  6.  ____ Fleet Enema (as directed)   __X__ Use CHG Soap/SAGE wipes as directed  __X__ Use inhalers on the day of surgery  ____ Stop metformin 2 days prior to surgery    __X__ Take 1/2 of usual insulin dose the night before surgery and none on the morning of surgery.   ____ Stop Coumadin/Plavix/aspirin on oK TO CONTINUE ASPIRIN DO NOT TAKE MORNING OF SURGERY  __X__ Stop Anti-inflammatories such as Advil, Aleve, Ibuprofen, Motrin, Naproxen, Naprosyn, Goodies,powder, or aspirin products.  OK to take Tylenol.   __X__ Stop supplements, Vitamin E, Fish Oil until after surgery.    ____ Bring C-Pap to the hospital.

## 2017-02-16 ENCOUNTER — Encounter: Payer: Self-pay | Admitting: *Deleted

## 2017-02-16 ENCOUNTER — Other Ambulatory Visit (INDEPENDENT_AMBULATORY_CARE_PROVIDER_SITE_OTHER): Payer: Self-pay | Admitting: Vascular Surgery

## 2017-02-16 MED ORDER — CEFAZOLIN SODIUM 1 G IJ SOLR: 2.0000 g | INTRAMUSCULAR | Status: AC

## 2017-02-16 MED ORDER — VANCOMYCIN HCL 10 G IV SOLR: 1000.0000 mg | Freq: Once | INTRAVENOUS | Status: AC

## 2017-02-16 NOTE — Pre-Procedure Instructions (Signed)
Met B results sent to Dr. Delana Meyer and Anesthesia for review.  Positive staph aureus results sent to Dr. Delana Meyer for review, asked if wanted any treatment and/ or other antibiotic?

## 2017-02-16 NOTE — Pre-Procedure Instructions (Signed)
CLEARED BY DR PARASCHOS AFTER REVIEWING EKG 02/16/17

## 2017-02-18 MED ORDER — VANCOMYCIN HCL IN DEXTROSE 1-5 GM/200ML-% IV SOLN
1000.0000 mg | Freq: Once | INTRAVENOUS | Status: AC
  Administered 2017-02-19: 1000 mg via INTRAVENOUS

## 2017-02-19 ENCOUNTER — Inpatient Hospital Stay
Admission: RE | Admit: 2017-02-19 | Discharge: 2017-02-25 | DRG: 037 | Disposition: E | Payer: Medicare Other | Source: Ambulatory Visit | Attending: Vascular Surgery | Admitting: Vascular Surgery

## 2017-02-19 ENCOUNTER — Inpatient Hospital Stay: Payer: Medicare Other | Admitting: Certified Registered"

## 2017-02-19 ENCOUNTER — Encounter: Admission: RE | Disposition: E | Payer: Self-pay | Source: Ambulatory Visit | Attending: Vascular Surgery

## 2017-02-19 ENCOUNTER — Encounter: Payer: Self-pay | Admitting: *Deleted

## 2017-02-19 DIAGNOSIS — I2510 Atherosclerotic heart disease of native coronary artery without angina pectoris: Secondary | ICD-10-CM | POA: Diagnosis present

## 2017-02-19 DIAGNOSIS — W0110XA Fall on same level from slipping, tripping and stumbling with subsequent striking against unspecified object, initial encounter: Secondary | ICD-10-CM | POA: Diagnosis not present

## 2017-02-19 DIAGNOSIS — E872 Acidosis, unspecified: Secondary | ICD-10-CM

## 2017-02-19 DIAGNOSIS — J9601 Acute respiratory failure with hypoxia: Secondary | ICD-10-CM | POA: Diagnosis not present

## 2017-02-19 DIAGNOSIS — Z66 Do not resuscitate: Secondary | ICD-10-CM | POA: Diagnosis present

## 2017-02-19 DIAGNOSIS — E1140 Type 2 diabetes mellitus with diabetic neuropathy, unspecified: Secondary | ICD-10-CM | POA: Diagnosis present

## 2017-02-19 DIAGNOSIS — E1122 Type 2 diabetes mellitus with diabetic chronic kidney disease: Secondary | ICD-10-CM | POA: Diagnosis present

## 2017-02-19 DIAGNOSIS — Z951 Presence of aortocoronary bypass graft: Secondary | ICD-10-CM | POA: Diagnosis not present

## 2017-02-19 DIAGNOSIS — Z87891 Personal history of nicotine dependence: Secondary | ICD-10-CM

## 2017-02-19 DIAGNOSIS — I248 Other forms of acute ischemic heart disease: Secondary | ICD-10-CM | POA: Diagnosis present

## 2017-02-19 DIAGNOSIS — I509 Heart failure, unspecified: Secondary | ICD-10-CM | POA: Diagnosis present

## 2017-02-19 DIAGNOSIS — R402114 Coma scale, eyes open, never, 24 hours or more after hospital admission: Secondary | ICD-10-CM | POA: Diagnosis not present

## 2017-02-19 DIAGNOSIS — I469 Cardiac arrest, cause unspecified: Secondary | ICD-10-CM

## 2017-02-19 DIAGNOSIS — R402234 Coma scale, best verbal response, inappropriate words, 24 hours or more after hospital admission: Secondary | ICD-10-CM | POA: Diagnosis not present

## 2017-02-19 DIAGNOSIS — R402314 Coma scale, best motor response, none, 24 hours or more after hospital admission: Secondary | ICD-10-CM | POA: Diagnosis not present

## 2017-02-19 DIAGNOSIS — Z8249 Family history of ischemic heart disease and other diseases of the circulatory system: Secondary | ICD-10-CM

## 2017-02-19 DIAGNOSIS — Z515 Encounter for palliative care: Secondary | ICD-10-CM | POA: Diagnosis present

## 2017-02-19 DIAGNOSIS — Z9981 Dependence on supplemental oxygen: Secondary | ICD-10-CM

## 2017-02-19 DIAGNOSIS — N183 Chronic kidney disease, stage 3 (moderate): Secondary | ICD-10-CM | POA: Diagnosis present

## 2017-02-19 DIAGNOSIS — R570 Cardiogenic shock: Secondary | ICD-10-CM | POA: Diagnosis not present

## 2017-02-19 DIAGNOSIS — I130 Hypertensive heart and chronic kidney disease with heart failure and stage 1 through stage 4 chronic kidney disease, or unspecified chronic kidney disease: Secondary | ICD-10-CM | POA: Diagnosis present

## 2017-02-19 DIAGNOSIS — I447 Left bundle-branch block, unspecified: Secondary | ICD-10-CM | POA: Diagnosis present

## 2017-02-19 DIAGNOSIS — J9690 Respiratory failure, unspecified, unspecified whether with hypoxia or hypercapnia: Secondary | ICD-10-CM

## 2017-02-19 DIAGNOSIS — Z4659 Encounter for fitting and adjustment of other gastrointestinal appliance and device: Secondary | ICD-10-CM

## 2017-02-19 DIAGNOSIS — Z452 Encounter for adjustment and management of vascular access device: Secondary | ICD-10-CM

## 2017-02-19 DIAGNOSIS — Z01818 Encounter for other preprocedural examination: Secondary | ICD-10-CM

## 2017-02-19 DIAGNOSIS — I6529 Occlusion and stenosis of unspecified carotid artery: Secondary | ICD-10-CM | POA: Diagnosis present

## 2017-02-19 DIAGNOSIS — J449 Chronic obstructive pulmonary disease, unspecified: Secondary | ICD-10-CM | POA: Diagnosis present

## 2017-02-19 DIAGNOSIS — N170 Acute kidney failure with tubular necrosis: Secondary | ICD-10-CM | POA: Diagnosis present

## 2017-02-19 DIAGNOSIS — I6522 Occlusion and stenosis of left carotid artery: Secondary | ICD-10-CM | POA: Diagnosis present

## 2017-02-19 HISTORY — PX: ENDARTERECTOMY: SHX5162

## 2017-02-19 LAB — ABO/RH: ABO/RH(D): O NEG

## 2017-02-19 LAB — GLUCOSE, CAPILLARY
GLUCOSE-CAPILLARY: 206 mg/dL — AB (ref 65–99)
Glucose-Capillary: 261 mg/dL — ABNORMAL HIGH (ref 65–99)
Glucose-Capillary: 288 mg/dL — ABNORMAL HIGH (ref 65–99)
Glucose-Capillary: 220 mg/dL — ABNORMAL HIGH (ref 65–99)

## 2017-02-19 LAB — MRSA PCR SCREENING: MRSA BY PCR: NEGATIVE

## 2017-02-19 SURGERY — ENDARTERECTOMY, CAROTID
Anesthesia: General | Laterality: Left | Wound class: Clean

## 2017-02-19 MED ORDER — GLYCOPYRROLATE 0.2 MG/ML IJ SOLN
INTRAMUSCULAR | Status: DC | PRN
  Administered 2017-02-19: .8 mg via INTRAVENOUS

## 2017-02-19 MED ORDER — LIDOCAINE HCL (PF) 2 % IJ SOLN
INTRAMUSCULAR | Status: AC
  Filled 2017-02-19: qty 10

## 2017-02-19 MED ORDER — REMIFENTANIL HCL 1 MG IV SOLR
INTRAVENOUS | Status: AC
  Filled 2017-02-19: qty 1000

## 2017-02-19 MED ORDER — CARVEDILOL 6.25 MG PO TABS
6.2500 mg | ORAL_TABLET | Freq: Two times a day (BID) | ORAL | Status: DC
  Administered 2017-02-19: 6.25 mg via ORAL
  Filled 2017-02-19 (×3): qty 1

## 2017-02-19 MED ORDER — LABETALOL HCL 5 MG/ML IV SOLN
INTRAVENOUS | Status: AC
  Filled 2017-02-19: qty 4

## 2017-02-19 MED ORDER — GLYCOPYRROLATE 0.2 MG/ML IJ SOLN
INTRAMUSCULAR | Status: AC
  Filled 2017-02-19: qty 4

## 2017-02-19 MED ORDER — CARVEDILOL 12.5 MG PO TABS
6.2500 mg | ORAL_TABLET | Freq: Two times a day (BID) | ORAL | Status: DC
  Administered 2017-02-20: 6.25 mg via ORAL
  Filled 2017-02-19: qty 1

## 2017-02-19 MED ORDER — SODIUM CHLORIDE 0.9 % IV SOLN
INTRAVENOUS | Status: DC
  Administered 2017-02-19: 11:00:00 via INTRAVENOUS

## 2017-02-19 MED ORDER — INSULIN ASPART 100 UNIT/ML ~~LOC~~ SOLN
0.0000 [IU] | Freq: Three times a day (TID) | SUBCUTANEOUS | Status: DC
  Administered 2017-02-19 – 2017-02-20 (×2): 5 [IU] via SUBCUTANEOUS
  Administered 2017-02-20: 8 [IU] via SUBCUTANEOUS
  Administered 2017-02-21: 3 [IU] via SUBCUTANEOUS
  Administered 2017-02-21: 5 [IU] via SUBCUTANEOUS
  Administered 2017-02-22: 8 [IU] via SUBCUTANEOUS
  Filled 2017-02-19 (×5): qty 1

## 2017-02-19 MED ORDER — ALBUTEROL SULFATE (2.5 MG/3ML) 0.083% IN NEBU: 3.0000 mL | INHALATION_SOLUTION | RESPIRATORY_TRACT | Status: DC | PRN

## 2017-02-19 MED ORDER — NEOSTIGMINE METHYLSULFATE 10 MG/10ML IV SOLN
INTRAVENOUS | Status: DC | PRN
  Administered 2017-02-19: 5 mg via INTRAVENOUS

## 2017-02-19 MED ORDER — PROPOFOL 10 MG/ML IV BOLUS
INTRAVENOUS | Status: AC
  Filled 2017-02-19: qty 20

## 2017-02-19 MED ORDER — EVICEL 2 ML EX KIT
PACK | CUTANEOUS | Status: AC
  Filled 2017-02-19: qty 1

## 2017-02-19 MED ORDER — SODIUM CHLORIDE 0.9 % IV SOLN
INTRAVENOUS | Status: DC | PRN
  Administered 2017-02-19: 25 ug/min via INTRAVENOUS

## 2017-02-19 MED ORDER — MORPHINE SULFATE (PF) 2 MG/ML IV SOLN: 2.0000 mg | INTRAVENOUS | Status: DC | PRN

## 2017-02-19 MED ORDER — ONDANSETRON HCL 4 MG/2ML IJ SOLN
INTRAMUSCULAR | Status: DC | PRN
  Administered 2017-02-19: 4 mg via INTRAVENOUS

## 2017-02-19 MED ORDER — FENTANYL CITRATE (PF) 100 MCG/2ML IJ SOLN
25.0000 ug | INTRAMUSCULAR | Status: DC | PRN
  Administered 2017-02-19 (×2): 25 ug via INTRAVENOUS

## 2017-02-19 MED ORDER — GUAIFENESIN-DM 100-10 MG/5ML PO SYRP
15.0000 mL | ORAL_SOLUTION | ORAL | Status: DC | PRN
  Filled 2017-02-19: qty 15

## 2017-02-19 MED ORDER — PANTOPRAZOLE SODIUM 40 MG IV SOLR
40.0000 mg | Freq: Every day | INTRAVENOUS | Status: DC
  Administered 2017-02-19 – 2017-02-21 (×3): 40 mg via INTRAVENOUS
  Filled 2017-02-19 (×3): qty 40

## 2017-02-19 MED ORDER — ACETAMINOPHEN 325 MG PO TABS: 325.0000 mg | ORAL_TABLET | ORAL | Status: DC | PRN

## 2017-02-19 MED ORDER — SUCCINYLCHOLINE CHLORIDE 20 MG/ML IJ SOLN
INTRAMUSCULAR | Status: DC | PRN
  Administered 2017-02-19: 90 mg via INTRAVENOUS

## 2017-02-19 MED ORDER — HYDRALAZINE HCL 20 MG/ML IJ SOLN: 5.0000 mg | INTRAMUSCULAR | Status: DC | PRN

## 2017-02-19 MED ORDER — INSULIN ASPART 100 UNIT/ML ~~LOC~~ SOLN
5.0000 [IU] | Freq: Once | SUBCUTANEOUS | Status: AC
  Administered 2017-02-19: 5 [IU] via SUBCUTANEOUS

## 2017-02-19 MED ORDER — ROCURONIUM BROMIDE 100 MG/10ML IV SOLN
INTRAVENOUS | Status: DC | PRN
  Administered 2017-02-19 (×2): 5 mg via INTRAVENOUS
  Administered 2017-02-19: 40 mg via INTRAVENOUS

## 2017-02-19 MED ORDER — FAMOTIDINE 20 MG PO TABS
ORAL_TABLET | ORAL | Status: AC
  Administered 2017-02-19: 20 mg via ORAL
  Filled 2017-02-19: qty 1

## 2017-02-19 MED ORDER — ACETAMINOPHEN 650 MG RE SUPP: 325.0000 mg | RECTAL | Status: DC | PRN

## 2017-02-19 MED ORDER — HEPARIN SODIUM (PORCINE) 1000 UNIT/ML IJ SOLN
INTRAMUSCULAR | Status: AC
  Filled 2017-02-19: qty 1

## 2017-02-19 MED ORDER — EPHEDRINE SULFATE 50 MG/ML IJ SOLN
INTRAMUSCULAR | Status: DC | PRN
  Administered 2017-02-19 (×2): 10 mg via INTRAVENOUS

## 2017-02-19 MED ORDER — ROCURONIUM BROMIDE 50 MG/5ML IV SOLN
INTRAVENOUS | Status: AC
  Filled 2017-02-19: qty 1

## 2017-02-19 MED ORDER — FENTANYL CITRATE (PF) 100 MCG/2ML IJ SOLN
INTRAMUSCULAR | Status: AC
  Administered 2017-02-19: 25 ug via INTRAVENOUS
  Filled 2017-02-19: qty 2

## 2017-02-19 MED ORDER — ONDANSETRON HCL 4 MG/2ML IJ SOLN: 4.0000 mg | Freq: Four times a day (QID) | INTRAMUSCULAR | Status: DC | PRN

## 2017-02-19 MED ORDER — OXYCODONE HCL 5 MG PO TABS
5.0000 mg | ORAL_TABLET | ORAL | Status: DC | PRN
  Administered 2017-02-19 – 2017-02-20 (×2): 5 mg via ORAL
  Filled 2017-02-19 (×2): qty 1

## 2017-02-19 MED ORDER — LIDOCAINE HCL (PF) 1 % IJ SOLN
INTRAMUSCULAR | Status: AC
  Filled 2017-02-19: qty 30

## 2017-02-19 MED ORDER — METOPROLOL TARTRATE 5 MG/5ML IV SOLN: 2.0000 mg | INTRAVENOUS | Status: DC | PRN

## 2017-02-19 MED ORDER — PROPOFOL 10 MG/ML IV BOLUS
INTRAVENOUS | Status: DC | PRN
  Administered 2017-02-19: 110 mg via INTRAVENOUS

## 2017-02-19 MED ORDER — VANCOMYCIN HCL IN DEXTROSE 1-5 GM/200ML-% IV SOLN
1000.0000 mg | Freq: Once | INTRAVENOUS | Status: AC
  Administered 2017-02-19: 1000 mg via INTRAVENOUS
  Filled 2017-02-19: qty 200

## 2017-02-19 MED ORDER — LIDOCAINE HCL (CARDIAC) 20 MG/ML IV SOLN
INTRAVENOUS | Status: DC | PRN
  Administered 2017-02-19: 60 mg via INTRAVENOUS
  Administered 2017-02-19: 20 mg via INTRAVENOUS

## 2017-02-19 MED ORDER — KCL IN DEXTROSE-NACL 20-5-0.9 MEQ/L-%-% IV SOLN
INTRAVENOUS | Status: DC
  Administered 2017-02-19 – 2017-02-20 (×2): via INTRAVENOUS
  Filled 2017-02-19 (×6): qty 1000

## 2017-02-19 MED ORDER — HEPARIN SODIUM (PORCINE) 1000 UNIT/ML IJ SOLN
INTRAMUSCULAR | Status: DC | PRN
  Administered 2017-02-19: 7000 [IU] via INTRAVENOUS

## 2017-02-19 MED ORDER — VITAMIN E 180 MG (400 UNIT) PO CAPS
400.0000 [IU] | ORAL_CAPSULE | Freq: Every day | ORAL | Status: DC
  Administered 2017-02-20: 400 [IU] via ORAL
  Filled 2017-02-19 (×3): qty 1

## 2017-02-19 MED ORDER — VITAMIN D 1000 UNITS PO TABS
1000.0000 [IU] | ORAL_TABLET | Freq: Every day | ORAL | Status: DC
  Administered 2017-02-20: 1000 [IU] via ORAL
  Filled 2017-02-19: qty 1

## 2017-02-19 MED ORDER — EPHEDRINE SULFATE 50 MG/ML IJ SOLN
INTRAMUSCULAR | Status: AC
Start: 2017-02-19 — End: 2017-02-19
  Filled 2017-02-19: qty 1

## 2017-02-19 MED ORDER — CHLORHEXIDINE GLUCONATE CLOTH 2 % EX PADS
6.0000 | MEDICATED_PAD | Freq: Once | CUTANEOUS | Status: DC
  Administered 2017-02-19: 6 via TOPICAL

## 2017-02-19 MED ORDER — EPHEDRINE SULFATE 50 MG/ML IJ SOLN
INTRAMUSCULAR | Status: AC
  Filled 2017-02-19: qty 1

## 2017-02-19 MED ORDER — LABETALOL HCL 5 MG/ML IV SOLN: 10.0000 mg | INTRAVENOUS | Status: DC | PRN

## 2017-02-19 MED ORDER — ASPIRIN EC 81 MG PO TBEC
81.0000 mg | DELAYED_RELEASE_TABLET | Freq: Every day | ORAL | Status: DC
  Administered 2017-02-20: 81 mg via ORAL
  Filled 2017-02-19: qty 1

## 2017-02-19 MED ORDER — FAMOTIDINE 20 MG PO TABS
20.0000 mg | ORAL_TABLET | Freq: Once | ORAL | Status: AC
Start: 2017-02-19 — End: 2017-02-19
  Administered 2017-02-19: 20 mg via ORAL

## 2017-02-19 MED ORDER — ALUM & MAG HYDROXIDE-SIMETH 200-200-20 MG/5ML PO SUSP
15.0000 mL | ORAL | Status: DC | PRN
  Filled 2017-02-19: qty 30

## 2017-02-19 MED ORDER — DOCUSATE SODIUM 100 MG PO CAPS
100.0000 mg | ORAL_CAPSULE | Freq: Every day | ORAL | Status: DC
  Administered 2017-02-20: 100 mg via ORAL
  Filled 2017-02-19: qty 1

## 2017-02-19 MED ORDER — SODIUM CHLORIDE 0.9 % IV SOLN
INTRAVENOUS | Status: DC | PRN
  Administered 2017-02-19: 11:00:00 via INTRAVENOUS

## 2017-02-19 MED ORDER — VANCOMYCIN HCL 500 MG IV SOLR
500.0000 mg | Freq: Two times a day (BID) | INTRAVENOUS | Status: AC
  Administered 2017-02-20: 500 mg via INTRAVENOUS
  Filled 2017-02-19: qty 500

## 2017-02-19 MED ORDER — HEPARIN SODIUM (PORCINE) 10000 UNIT/ML IJ SOLN
INTRAMUSCULAR | Status: AC
  Filled 2017-02-19: qty 1

## 2017-02-19 MED ORDER — SODIUM CHLORIDE 0.9 % IV SOLN: 500.0000 mL | Freq: Once | INTRAVENOUS | Status: DC | PRN

## 2017-02-19 MED ORDER — ONDANSETRON HCL 4 MG/2ML IJ SOLN: 4.0000 mg | Freq: Once | INTRAMUSCULAR | Status: DC | PRN

## 2017-02-19 MED ORDER — ONDANSETRON HCL 4 MG/2ML IJ SOLN
INTRAMUSCULAR | Status: AC
  Filled 2017-02-19: qty 2

## 2017-02-19 MED ORDER — LABETALOL HCL 5 MG/ML IV SOLN
INTRAVENOUS | Status: DC | PRN
  Administered 2017-02-19 (×2): 5 mg via INTRAVENOUS

## 2017-02-19 MED ORDER — TORSEMIDE 20 MG PO TABS
20.0000 mg | ORAL_TABLET | Freq: Every day | ORAL | Status: DC
  Administered 2017-02-20: 20 mg via ORAL
  Filled 2017-02-19: qty 1

## 2017-02-19 MED ORDER — PHENYLEPHRINE HCL 10 MG/ML IJ SOLN
INTRAMUSCULAR | Status: DC | PRN
  Administered 2017-02-19 (×3): 100 ug via INTRAVENOUS

## 2017-02-19 MED ORDER — EVICEL 2 ML EX KIT
PACK | CUTANEOUS | Status: DC | PRN
  Administered 2017-02-19: 2 mL

## 2017-02-19 MED ORDER — VANCOMYCIN HCL IN DEXTROSE 1-5 GM/200ML-% IV SOLN
INTRAVENOUS | Status: AC
  Administered 2017-02-19: 1000 mg via INTRAVENOUS
  Filled 2017-02-19: qty 200

## 2017-02-19 MED ORDER — PHENOL 1.4 % MT LIQD
1.0000 | OROMUCOSAL | Status: DC | PRN
  Filled 2017-02-19 (×2): qty 177

## 2017-02-19 MED ORDER — NEOSTIGMINE METHYLSULFATE 10 MG/10ML IV SOLN
INTRAVENOUS | Status: AC
  Filled 2017-02-19: qty 1

## 2017-02-19 MED ORDER — FENTANYL CITRATE (PF) 100 MCG/2ML IJ SOLN
INTRAMUSCULAR | Status: AC
  Filled 2017-02-19: qty 2

## 2017-02-19 MED ORDER — REMIFENTANIL HCL 1 MG IV SOLR
INTRAVENOUS | Status: DC | PRN
  Administered 2017-02-19: .15 ug/kg/min via INTRAVENOUS

## 2017-02-19 MED ORDER — INSULIN ASPART 100 UNIT/ML ~~LOC~~ SOLN
SUBCUTANEOUS | Status: AC
  Filled 2017-02-19: qty 1

## 2017-02-19 MED ORDER — FENTANYL CITRATE (PF) 100 MCG/2ML IJ SOLN
INTRAMUSCULAR | Status: DC | PRN
  Administered 2017-02-19: 50 ug via INTRAVENOUS
  Administered 2017-02-19: 25 ug via INTRAVENOUS

## 2017-02-19 SURGICAL SUPPLY — 71 items
ADH SKN CLS APL DERMABOND .7 (GAUZE/BANDAGES/DRESSINGS) ×1
APPLIER CLIP 11 MED OPEN (CLIP)
APPLIER CLIP 9.375 SM OPEN (CLIP)
APR CLP MED 11 20 MLT OPN (CLIP)
APR CLP SM 9.3 20 MLT OPN (CLIP)
BAG DECANTER FOR FLEXI CONT (MISCELLANEOUS) ×3 IMPLANT
BLADE SURG 15 STRL LF DISP TIS (BLADE) ×1 IMPLANT
BLADE SURG 15 STRL SS (BLADE) ×3
BLADE SURG SZ11 CARB STEEL (BLADE) ×3 IMPLANT
BOOT SUTURE AID YELLOW STND (SUTURE) ×6 IMPLANT
BRUSH SCRUB EZ  4% CHG (MISCELLANEOUS) ×2
BRUSH SCRUB EZ 4% CHG (MISCELLANEOUS) ×1 IMPLANT
CANISTER SUCT 1200ML W/VALVE (MISCELLANEOUS) ×3 IMPLANT
CLIP APPLIE 11 MED OPEN (CLIP) IMPLANT
CLIP APPLIE 9.375 SM OPEN (CLIP) IMPLANT
DERMABOND ADVANCED (GAUZE/BANDAGES/DRESSINGS) ×2
DERMABOND ADVANCED .7 DNX12 (GAUZE/BANDAGES/DRESSINGS) ×1 IMPLANT
DRAPE INCISE IOBAN 66X45 STRL (DRAPES) ×3 IMPLANT
DRAPE LAPAROTOMY 100X77 ABD (DRAPES) ×3 IMPLANT
DRAPE SHEET LG 3/4 BI-LAMINATE (DRAPES) ×3 IMPLANT
DRESSING SURGICEL FIBRLLR 1X2 (HEMOSTASIS) ×1 IMPLANT
DRSG SURGICEL FIBRILLAR 1X2 (HEMOSTASIS) ×3
DURAPREP 26ML APPLICATOR (WOUND CARE) ×3 IMPLANT
ELECT CAUTERY BLADE 6.4 (BLADE) ×3 IMPLANT
ELECT REM PT RETURN 9FT ADLT (ELECTROSURGICAL) ×3
ELECTRODE REM PT RTRN 9FT ADLT (ELECTROSURGICAL) ×1 IMPLANT
GLOVE BIO SURGEON STRL SZ7 (GLOVE) ×3 IMPLANT
GLOVE INDICATOR 7.5 STRL GRN (GLOVE) ×3 IMPLANT
GLOVE SURG SYN 8.0 (GLOVE) ×3 IMPLANT
GLOVE SURG SYN 8.0 PF PI (GLOVE) ×1 IMPLANT
GOWN STRL REUS W/ TWL LRG LVL3 (GOWN DISPOSABLE) ×2 IMPLANT
GOWN STRL REUS W/ TWL XL LVL3 (GOWN DISPOSABLE) ×1 IMPLANT
GOWN STRL REUS W/TWL LRG LVL3 (GOWN DISPOSABLE) ×9
GOWN STRL REUS W/TWL XL LVL3 (GOWN DISPOSABLE) ×3
HOLDER FOLEY CATH W/STRAP (MISCELLANEOUS) ×3 IMPLANT
IV NS 500ML (IV SOLUTION) ×3
IV NS 500ML BAXH (IV SOLUTION) ×1 IMPLANT
KIT RM TURNOVER STRD PROC AR (KITS) ×3 IMPLANT
LABEL OR SOLS (LABEL) ×3 IMPLANT
LOOP RED MAXI  1X406MM (MISCELLANEOUS) ×4
LOOP VESSEL MAXI 1X406 RED (MISCELLANEOUS) ×2 IMPLANT
LOOP VESSEL MINI 0.8X406 BLUE (MISCELLANEOUS) ×1 IMPLANT
LOOPS BLUE MINI 0.8X406MM (MISCELLANEOUS) ×2
NDL FILTER BLUNT 18X1 1/2 (NEEDLE) ×1 IMPLANT
NDL HYPO 25X1 1.5 SAFETY (NEEDLE) ×1 IMPLANT
NEEDLE FILTER BLUNT 18X 1/2SAF (NEEDLE) ×2
NEEDLE FILTER BLUNT 18X1 1/2 (NEEDLE) ×1 IMPLANT
NEEDLE HYPO 25X1 1.5 SAFETY (NEEDLE) ×3 IMPLANT
NS IRRIG 500ML POUR BTL (IV SOLUTION) ×3 IMPLANT
PACK BASIN MAJOR ARMC (MISCELLANEOUS) ×3 IMPLANT
PATCH CAROTID ECM VASC 1X10 (Prosthesis & Implant Heart) ×2 IMPLANT
PENCIL ELECTRO HAND CTR (MISCELLANEOUS) IMPLANT
SHUNT CAROTID STR REINF 3.0X4. (MISCELLANEOUS) ×3 IMPLANT
SUT MNCRL+ 5-0 UNDYED PC-3 (SUTURE) ×1 IMPLANT
SUT MONOCRYL 5-0 (SUTURE) ×2
SUT PROLENE 6 0 BV (SUTURE) ×36 IMPLANT
SUT PROLENE 7 0 BV 1 (SUTURE) ×18 IMPLANT
SUT SILK 2 0 (SUTURE) ×3
SUT SILK 2-0 18XBRD TIE 12 (SUTURE) ×1 IMPLANT
SUT SILK 3 0 (SUTURE) ×3
SUT SILK 3-0 18XBRD TIE 12 (SUTURE) ×1 IMPLANT
SUT SILK 4 0 (SUTURE) ×3
SUT SILK 4-0 18XBRD TIE 12 (SUTURE) ×1 IMPLANT
SUT VIC AB 3-0 SH 27 (SUTURE) ×3
SUT VIC AB 3-0 SH 27X BRD (SUTURE) ×1 IMPLANT
SYR 10ML LL (SYRINGE) ×3 IMPLANT
SYR 20CC LL (SYRINGE) ×3 IMPLANT
SYR 3ML LL SCALE MARK (SYRINGE) ×3 IMPLANT
TRAY FOLEY W/METER SILVER 16FR (SET/KITS/TRAYS/PACK) ×3 IMPLANT
TUBING CONNECTING 10 (TUBING) IMPLANT
TUBING CONNECTING 10' (TUBING)

## 2017-02-19 NOTE — Anesthesia Preprocedure Evaluation (Signed)
Anesthesia Evaluation  Patient identified by MRN, date of birth, ID band Patient awake    Reviewed: Allergy & Precautions, H&P , NPO status , Patient's Chart, lab work & pertinent test results, reviewed documented beta blocker date and time   Airway Mallampati: II  TM Distance: >3 FB Neck ROM: full    Dental  (+) Upper Dentures, Lower Dentures   Pulmonary neg pulmonary ROS, COPD,  COPD inhaler and oxygen dependent, former smoker,    Pulmonary exam normal        Cardiovascular hypertension, + CAD, + Peripheral Vascular Disease and +CHF  negative cardio ROS Normal cardiovascular exam Rhythm:regular Rate:Normal     Neuro/Psych negative neurological ROS  negative psych ROS   GI/Hepatic negative GI ROS, Neg liver ROS,   Endo/Other  negative endocrine ROSdiabetes, Poorly Controlled, Type 1, Insulin Dependent, Oral Hypoglycemic Agents  Renal/GU Renal diseasenegative Renal ROS  negative genitourinary   Musculoskeletal   Abdominal   Peds  Hematology negative hematology ROS (+)   Anesthesia Other Findings Past Medical History: No date: CAD (coronary artery disease) No date: CKD (chronic kidney disease) No date: COPD (chronic obstructive pulmonary disease) (HCC) No date: Diabetes mellitus with neuropathy (HCC) No date: DM type 2 (diabetes mellitus, type 2) (HCC) No date: Hypertension Past Surgical History: No date: ABDOMINAL AORTIC ANEURYSM REPAIR No date: CHOLECYSTECTOMY No date: CORONARY ARTERY BYPASS GRAFT No date: RENAL ARTERY STENT   Reproductive/Obstetrics negative OB ROS                             Anesthesia Physical Anesthesia Plan  ASA: III  Anesthesia Plan: General ETT   Post-op Pain Management:    Induction:   PONV Risk Score and Plan: 3 and Ondansetron, Dexamethasone, Midazolam and Propofol infusion  Airway Management Planned:   Additional Equipment:   Intra-op Plan:    Post-operative Plan:   Informed Consent: I have reviewed the patients History and Physical, chart, labs and discussed the procedure including the risks, benefits and alternatives for the proposed anesthesia with the patient or authorized representative who has indicated his/her understanding and acceptance.   Dental Advisory Given  Plan Discussed with: CRNA  Anesthesia Plan Comments:         Anesthesia Quick Evaluation

## 2017-02-19 NOTE — Progress Notes (Signed)
Inpatient Diabetes Program Recommendations  AACE/ADA: New Consensus Statement on Inpatient Glycemic Control (2015)  Target Ranges:  Prepandial:   less than 140 mg/dL      Peak postprandial:   less than 180 mg/dL (1-2 hours)      Critically ill patients:  140 - 180 mg/dL   Lab Results  Component Value Date   GLUCAP 288 (H) 2016/06/29   HGBA1C 10.8 (H) 04/14/2015    Review of Glycemic Control Results for Kenneth Massey, Kenneth Massey (MRN [id number]) as of 2016/06/29 14:07  Ref. Range 2016/06/29 09:44 2016/06/29 11:29  Glucose-Capillary Latest Ref Range: 65 - 99 mg/dL 409261 (H) 811288 (H)   Diabetes history: DM2 Outpatient Diabetes medications: 70/30 insulin mix 5 units bid ac meals + Amaryl 4 mg qd  Inpatient Diabetes Program Recommendations:  Noted patient in OR. A1c 02/02/17 7.8. While in the hospital please consider: -Lantus 5 units qd -Novolog sensitive correction scale q 4 hrs while NPO and change to tid when eating  Thank you, [name]. Marius Betts, RN, MSN, CDE  Diabetes Coordinator Inpatient Glycemic Control Team Team Pager [phone] (8am-5pm) 2016/06/29 2:11 PM

## 2017-02-19 NOTE — Op Note (Signed)
[facility]   OPERATIVE NOTE  PROCEDURE:   1.  Left carotid endarterectomy with CorMatrix arterial patch reconstruction  PRE-OPERATIVE DIAGNOSIS: 1.  Critical carotid stenosis 2.  Diabetes; coronary artery disease  POST-OPERATIVE DIAGNOSIS: same as above   SURGEON: Renford DillsGregory G Schnier, MD  ASSISTANT(S): Myrle ShengJessica Thibault  ANESTHESIA: general  ESTIMATED BLOOD LOSS: 100 cc  FINDING(S): 1.  Extensive calcified carotid plaque.  SPECIMEN(S):  Carotid plaque (sent to Pathology)  INDICATIONS:   Kenneth Massey is a 83 y.o. y.o. male who presents with left carotid stenosis of 90 %.  The risks, benefits, and alternatives to carotid endarterectomy were discussed with the patient. The differences between carotid stenting and carotid endarterectomy were reviewed.  The patient voiced understanding and appears to be aware that the risks of carotid endarterectomy include but are not limited to: bleeding, infection, stroke, myocardial infarction, death, cranial nerve injuries both temporary and permanent, neck hematoma, possible airway compromise, labile blood pressure post-operatively, cerebral hyperperfusion syndrome, and possible need for additional interventions in the future. The patient is aware of the risks and agrees to proceed forward with the procedure.  DESCRIPTION: After full informed written consent was obtained from the patient, the patient was brought back to the operating room and placed supine upon the operating table.  Prior to induction, the patient received IV antibiotics.  After obtaining adequate anesthesia, the patient was placed a supine position with a shoulder roll in place and the patient's neck slightly hyperextended and rotated away from the surgical site.  The patient was prepped in the standard fashion for a carotid endarterectomy.    The incision was made anterior to the sternocleidomastoid muscle and dissected down through the subcutaneous  tissue.  The platysmas was opened with electrocautery.  The internal jugular vein and facial vein were identified.  The facial vein is ligated and divided between 2-0 silk ties.  The omohyoid was identified in the common carotid artery exposed at this level. The dissection was there in carried out along the carotid artery in a cranial direction.  The dissection was then carried along periadventitial plane along the common carotid artery up to the bifurcation. The external carotid artery was identified. Vessel loops were then placed around the external carotid artery as well as the superior thyroid artery. In the process of this dissection, the hypoglossal nerve was identified and protected from harm.  The internal carotid artery was then dissected circumferentially just beyond an area in the internal carotid artery distal to the plaque.    At this point, we gave the patient 7000 units of intravenous heparin.  After this was allowed to circulate for several minutes, the common carotid followed by the external carotid and then the internal carotid artery were clamped.  Arteriotomy was made in the common carotid artery with a 11 blade, and extended the arteriotomy with a Potts scissor down into the common carotid artery, then the arteriotomy was carried through the bifurcation into the internal carotid artery until I reached an area that was not diseased.  At this point, a Sundt shunt was placed.  The endarterectomy was begun in the common carotid artery with a Runner, broadcasting/film/videoenfield elevator and carried this dissection down into the common carotid artery circumferentially.  Then I transected the plaque at a segment where it was adherent and transected the plaque with Potts scissors.  I then carried this dissection up into the external carotid artery.  The plaque was extracted by unclamping the external carotid artery  and performing an eversion endarterectomy.  The dissection was then carried into the internal carotid artery  where a  feathered end point was created.  The plaque was passed off the field as a specimen.  The distal endpoint was tacked down with 6 interrupted 7-0 Prolene sutures.  A CorMatrix arterial patch was delivered onto the field and trimmed appropriately for the artery and sewed in place with 6-0 Prolene using a 4 quadrant technique.  The medial suture line was completed and the lateral suture line was run approximately one quarter the length of the arteriotomy.  Prior to completing this patch angioplasty, the shunt was removed, the internal carotid artery was flushed and there was excellent backbleeding.  The carotid artery repair was flushed with heparinized saline and then the patch angioplasty was completed in the usual fashion.  The flow was then reestablished first to the external carotid artery and then the internal carotid artery to prevent distal embolization.   Several minutes of pressure were held and 6-0 Prolene patch sutures were used as need for hemostasis.  At this point, I placed Surgicel and Evicel topical hemostatic agents.  There was no more active bleeding in the surgical site.  The sternocleidomastoid space was closed with three interrupted 3-0 Vicryl sutures. I then reapproximated the platysma muscle with a running stitch of 3-0 Vicryl.  The skin was then closed with a running subcuticular 4-0 Monocryl.  The skin was then cleaned, dried and Dermabond was used to reinforce the skin closure.  The patient awakened and was taken to the recovery room in stable condition, following commands and moving all four extremities without any apparent deficits.    COMPLICATIONS: none  CONDITION: stable  Levora Dredge 02/07/2017<2:38 PM

## 2017-02-19 NOTE — H&P (Signed)
[facility] History & Physical Update  The patient was interviewed and re-examined.  The patient's previous History and Physical has been reviewed and is unchanged.  There is no change in the plan of care. We plan to proceed with the scheduled procedure.  Levora DredgeGregory Schnier, MD  2016/08/09, 10:41 AM

## 2017-02-19 NOTE — Anesthesia Procedure Notes (Signed)
Procedure Name: Intubation Performed by: Lance Muss Pre-anesthesia Checklist: Patient identified, Patient being monitored, Timeout performed, Emergency Drugs available and Suction available Patient Re-evaluated:Patient Re-evaluated prior to induction Oxygen Delivery Method: Circle system utilized Preoxygenation: Pre-oxygenation with 100% oxygen Induction Type: IV induction Ventilation: Mask ventilation without difficulty Laryngoscope Size: Mac and 4 Grade View: Grade I Tube type: Oral Tube size: 7.5 mm Number of attempts: 1 Airway Equipment and Method: Stylet Placement Confirmation: ETT inserted through vocal cords under direct vision,  positive ETCO2 and breath sounds checked- equal and bilateral Secured at: 23 cm Tube secured with: Tape Dental Injury: Teeth and Oropharynx as per pre-operative assessment

## 2017-02-19 NOTE — Anesthesia Procedure Notes (Signed)
Arterial Line Insertion Performed by: Franne GripADAMS, JAMES G, Lejend Dalby, CRNA  Patient location: OR. Preanesthetic checklist: patient identified, IV checked, site marked, risks and benefits discussed, surgical consent, monitors and equipment checked, pre-op evaluation, timeout performed and anesthesia consent Right, radial was placed Catheter size: 20 G Hand hygiene performed  Allen's test indicative of satisfactory collateral circulation Attempts: 3 Following insertion, dressing applied and Biopatch. Patient tolerated the procedure well with no immediate complications.

## 2017-02-19 NOTE — Anesthesia Post-op Follow-up Note (Signed)
Anesthesia QCDR form completed.        

## 2017-02-19 NOTE — Anesthesia Procedure Notes (Deleted)
Performed by: Marlane Hirschmann       

## 2017-02-19 NOTE — OR Nursing (Signed)
Repeat BS after 30 minutes of insulin being give was 288. Dr. Jarvis MorganAdam's notified.

## 2017-02-19 NOTE — Transfer of Care (Signed)
Immediate Anesthesia Transfer of Care Note  Patient: Kenneth Massey  Procedure(s) Performed: ENDARTERECTOMY CAROTID WITH PATCH (Left )  Patient Location: PACU  Anesthesia Type:General  Level of Consciousness: awake and responds to stimulation  Airway & Oxygen Therapy: Patient Spontanous Breathing and Patient connected to nasal cannula oxygen  Post-op Assessment: Report given to RN and Post -op Vital signs reviewed and stable  Post vital signs: Reviewed and stable  Last Vitals:  Vitals:   02/06/2017 1010 02/21/2017 1438  BP:  (!) 131/59  Pulse:  75  Resp:  14  Temp:    SpO2: 100% 91%    Last Pain:  Vitals:   02/14/2017 1009  TempSrc: Oral         Complications: No apparent anesthesia complications

## 2017-02-20 ENCOUNTER — Inpatient Hospital Stay: Payer: Medicare Other

## 2017-02-20 DIAGNOSIS — J9601 Acute respiratory failure with hypoxia: Secondary | ICD-10-CM

## 2017-02-20 DIAGNOSIS — I6522 Occlusion and stenosis of left carotid artery: Principal | ICD-10-CM

## 2017-02-20 DIAGNOSIS — I469 Cardiac arrest, cause unspecified: Secondary | ICD-10-CM

## 2017-02-20 LAB — MAGNESIUM: Magnesium: 1.7 mg/dL (ref 1.7–2.4)

## 2017-02-20 LAB — CBC
HCT: 41.2 % (ref 40.0–52.0)
HCT: 47.6 % (ref 40.0–52.0)
Hemoglobin: 13.7 g/dL (ref 13.0–18.0)
Hemoglobin: 15.6 g/dL (ref 13.0–18.0)
MCH: 30.3 pg (ref 26.0–34.0)
MCH: 30.5 pg (ref 26.0–34.0)
MCHC: 33.3 g/dL (ref 32.0–36.0)
MCHC: 32.7 g/dL (ref 32.0–36.0)
MCV: 91 fL (ref 80.0–100.0)
MCV: 93.4 fL (ref 80.0–100.0)
PLATELETS: 136 10*3/uL — AB (ref 150–440)
Platelets: 121 10*3/uL — ABNORMAL LOW (ref 150–440)
RBC: 4.52 MIL/uL (ref 4.40–5.90)
RBC: 5.1 MIL/uL (ref 4.40–5.90)
RDW: 15.7 % — AB (ref 11.5–14.5)
RDW: 16 % — AB (ref 11.5–14.5)
WBC: 7.5 10*3/uL (ref 3.8–10.6)
WBC: 11.7 10*3/uL — ABNORMAL HIGH (ref 3.8–10.6)

## 2017-02-20 LAB — BLOOD GAS, ARTERIAL
ACID-BASE DEFICIT: 3.3 mmol/L — AB (ref 0.0–2.0)
BICARBONATE: 24.7 mmol/L (ref 20.0–28.0)
FIO2: 1
MECHVT: 500 mL
Mechanical Rate: 16
O2 SAT: 94.9 %
PEEP/CPAP: 5 cmH2O
PH ART: 7.26 — AB (ref 7.350–7.450)
Patient temperature: 37
RATE: 16 resp/min
pCO2 arterial: 55 mmHg — ABNORMAL HIGH (ref 32.0–48.0)
pO2, Arterial: 86 mmHg (ref 83.0–108.0)

## 2017-02-20 LAB — COMPREHENSIVE METABOLIC PANEL
ALBUMIN: 3.1 g/dL — AB (ref 3.5–5.0)
ALT: 44 U/L (ref 17–63)
ANION GAP: 10 (ref 5–15)
AST: 69 U/L — ABNORMAL HIGH (ref 15–41)
Alkaline Phosphatase: 71 U/L (ref 38–126)
BILIRUBIN TOTAL: 1.3 mg/dL — AB (ref 0.3–1.2)
BUN: 36 mg/dL — ABNORMAL HIGH (ref 6–20)
CHLORIDE: 105 mmol/L (ref 101–111)
CO2: 23 mmol/L (ref 22–32)
Calcium: 7.6 mg/dL — ABNORMAL LOW (ref 8.9–10.3)
Creatinine, Ser: 2.12 mg/dL — ABNORMAL HIGH (ref 0.61–1.24)
GFR calc Af Amer: 31 mL/min — ABNORMAL LOW (ref >60)
GFR calc non Af Amer: 27 mL/min — ABNORMAL LOW (ref >60)
GLUCOSE: 180 mg/dL — AB (ref 65–99)
POTASSIUM: 4 mmol/L (ref 3.5–5.1)
SODIUM: 138 mmol/L (ref 135–145)
TOTAL PROTEIN: 6.3 g/dL — AB (ref 6.5–8.1)

## 2017-02-20 LAB — GLUCOSE, CAPILLARY
GLUCOSE-CAPILLARY: 263 mg/dL — AB (ref 65–99)
Glucose-Capillary: 239 mg/dL — ABNORMAL HIGH (ref 65–99)

## 2017-02-20 LAB — BASIC METABOLIC PANEL
Anion gap: 7 (ref 5–15)
BUN: 38 mg/dL — AB (ref 6–20)
CALCIUM: 7.9 mg/dL — AB (ref 8.9–10.3)
CO2: 25 mmol/L (ref 22–32)
CREATININE: 1.88 mg/dL — AB (ref 0.61–1.24)
Chloride: 104 mmol/L (ref 101–111)
GFR calc non Af Amer: 31 mL/min — ABNORMAL LOW (ref >60)
GFR, EST AFRICAN AMERICAN: 36 mL/min — AB (ref >60)
Glucose, Bld: 261 mg/dL — ABNORMAL HIGH (ref 65–99)
Potassium: 4.4 mmol/L (ref 3.5–5.1)
SODIUM: 136 mmol/L (ref 135–145)

## 2017-02-20 LAB — TROPONIN I: Troponin I: 0.04 ng/mL (ref <0.03)

## 2017-02-20 MED ORDER — FENTANYL 2500MCG IN NS 250ML (10MCG/ML) PREMIX INFUSION
200.0000 ug/h | INTRAVENOUS | Status: DC
  Administered 2017-02-20: 200 ug/h via INTRAVENOUS
  Administered 2017-02-21: 50 ug/h via INTRAVENOUS
  Administered 2017-02-21: 200 ug/h via INTRAVENOUS
  Administered 2017-02-21: 175 ug/h via INTRAVENOUS
  Administered 2017-02-21: 100 ug/h via INTRAVENOUS
  Filled 2017-02-20 (×3): qty 250

## 2017-02-20 MED ORDER — ORAL CARE MOUTH RINSE
15.0000 mL | OROMUCOSAL | Status: DC
  Administered 2017-02-20 – 2017-02-22 (×14): 15 mL via OROMUCOSAL

## 2017-02-20 MED ORDER — FENTANYL 2500MCG IN NS 250ML (10MCG/ML) PREMIX INFUSION
INTRAVENOUS | Status: AC
  Administered 2017-02-20: 16:00:00
  Filled 2017-02-20: qty 250

## 2017-02-20 MED ORDER — ROCURONIUM BROMIDE 50 MG/5ML IV SOLN
1.0000 mg/kg | Freq: Once | INTRAVENOUS | Status: AC
  Administered 2017-02-20: 76.4 mg via INTRAVENOUS

## 2017-02-20 MED ORDER — CHLORHEXIDINE GLUCONATE 0.12% ORAL RINSE (MEDLINE KIT)
15.0000 mL | Freq: Two times a day (BID) | OROMUCOSAL | Status: DC
  Administered 2017-02-20 – 2017-02-22 (×4): 15 mL via OROMUCOSAL

## 2017-02-20 MED ORDER — ETOMIDATE 2 MG/ML IV SOLN
0.3000 mg/kg | Freq: Once | INTRAVENOUS | Status: AC
  Administered 2017-02-20: 22.92 mg via INTRAVENOUS

## 2017-02-20 MED ORDER — PROPOFOL 1000 MG/100ML IV EMUL
INTRAVENOUS | Status: AC
  Administered 2017-02-20: 17:00:00
  Filled 2017-02-20: qty 100

## 2017-02-20 MED ORDER — NOREPINEPHRINE BITARTRATE 1 MG/ML IV SOLN
0.0000 ug/min | INTRAVENOUS | Status: DC
  Administered 2017-02-20: 15 ug/min via INTRAVENOUS
  Administered 2017-02-21 (×2): 9 ug/min via INTRAVENOUS
  Administered 2017-02-22: 18 ug/min via INTRAVENOUS
  Administered 2017-02-22: 20 ug/min via INTRAVENOUS
  Filled 2017-02-20 (×6): qty 4

## 2017-02-20 NOTE — Anesthesia Postprocedure Evaluation (Signed)
Anesthesia Post Note  Patient: Kenneth Massey  Procedure(s) Performed: ENDARTERECTOMY CAROTID WITH PATCH (Left )  Patient location during evaluation: ICU Anesthesia Type: General Level of consciousness: awake and alert Pain management: pain level controlled Vital Signs Assessment: post-procedure vital signs reviewed and stable Respiratory status: spontaneous breathing, nonlabored ventilation, respiratory function stable and patient connected to nasal cannula oxygen Cardiovascular status: blood pressure returned to baseline and stable Postop Assessment: no apparent nausea or vomiting Anesthetic complications: no     Last Vitals:  Vitals:   02/20/17 0500 02/20/17 0600  BP: 123/64 (!) 123/58  Pulse: 77 77  Resp: 16 14  Temp:    SpO2: 93% 91%    Last Pain:  Vitals:   02/20/17 0400  TempSrc: Oral  PainSc: 1                  Cleda MccreedyJoseph K Tesla Bochicchio

## 2017-02-20 NOTE — Progress Notes (Signed)
Chaplain responded to a code blue page for pt in ICU18. Medical Team [facility] performing CPR [facility] CH spent time with pt's wife and daughter, Kennyth ArnoldStacy, who was tearful and very worried. Pt was resuscitated and was able to talk to the dc and nurses, before being sedated. CH provided emotional support, prayer and pastoral care to pt's wife, daughter, and son-in-law, who arrived later. CH to follow up pt as needed.     02/20/17 1600  Clinical Encounter Type  Visited With Patient;Patient and family together;Health care provider  Visit Type Initial;Follow-up;Spiritual support;Code  Referral From Nurse  Consult/Referral To Chaplain  Spiritual Encounters  Spiritual Needs Prayer;Emotional;Other (Comment)

## 2017-02-20 NOTE — Discharge Summary (Signed)
[facility]    Discharge Summary    Patient ID:  Kenneth Massey MRN: [id number] DOB/AGE: 1930/03/24 83 y.o.  Admit date: 2017/03/08 Discharge date: 02/20/2017 Date of Surgery: 03-08-2017 Surgeon: Surgeon(s): Schnier, Latina Craver, MD  Admission Diagnosis: CAROTID ARTERY STENOSIS  Discharge Diagnoses:  CAROTID ARTERY STENOSIS  Secondary Diagnoses: Past Medical History:  Diagnosis Date  . CAD (coronary artery disease)   . CKD (chronic kidney disease)   . COPD (chronic obstructive pulmonary disease) (HCC)   . Diabetes mellitus with neuropathy (HCC)   . DM type 2 (diabetes mellitus, type 2) (HCC)   . Hypertension     Procedure(s): ENDARTERECTOMY CAROTID WITH PATCH  Discharged Condition: good  HPI:  The patient presented yesterday for left carotid endarterectomy.  He was found to have greater than 90% stenosis by duplex ultrasound.  After undergoing cardiac clearance he is now ready for carotid surgery.  On the day of admission the patient underwent successful left carotid endarterectomy without complication.  Postoperatively he has had a normal course.  He was transferred to the intensive care unit where he is done well overnight with normal blood pressure.  He is on home O2 is at baseline and therefore his O2 saturations in the low 90s and high 80s on room air are at baseline.  With 3 L nasal O2 he is in the mid to high 90s.  He is felt fit for discharge.  He will be discharged home he will continue his oxygen therapy at home and slowly wean back to nighttime use only as tolerated.  Hospital Course:  Kenneth Massey is a 83 y.o. male is S/P Left Procedure(s): ENDARTERECTOMY CAROTID WITH PATCH Extubated: POD # 0 Physical exam: Neck clean dry and intact, neurologically patient is intact Post-op wounds clean, dry, intact or healing well Pt. Ambulating, voiding and taking PO diet without difficulty. Pt pain controlled with PO pain meds. Labs  as below Complications:none  Consults:    Significant Diagnostic Studies: CBC Lab Results  Component Value Date   WBC 7.5 02/20/2017   HGB 13.7 02/20/2017   HCT 41.2 02/20/2017   MCV 91.0 02/20/2017   PLT 121 (L) 02/20/2017    BMET    Component Value Date/Time   NA 136 02/20/2017 0552   NA 139 08/03/2011 0914   K 4.4 02/20/2017 0552   K 4.5 08/03/2011 0914   CL 104 02/20/2017 0552   CL 103 08/03/2011 0914   CO2 25 02/20/2017 0552   CO2 29 08/03/2011 0914   GLUCOSE 261 (H) 02/20/2017 0552   GLUCOSE 209 (H) 08/03/2011 0914   BUN 38 (H) 02/20/2017 0552   BUN 17 08/03/2011 0914   CREATININE 1.88 (H) 02/20/2017 0552   CREATININE 1.36 (H) 08/03/2011 0914   CALCIUM 7.9 (L) 02/20/2017 0552   CALCIUM 9.1 08/03/2011 0914   GFRNONAA 31 (L) 02/20/2017 0552   GFRNONAA 54 (L) 08/03/2011 0914   GFRAA 36 (L) 02/20/2017 0552   GFRAA >60 08/03/2011 0914   COAG Lab Results  Component Value Date   INR 1.04 02/15/2017   INR 1.1 06/25/2011   INR 1.5 10/19/2008     Disposition:  Discharge to :Home Discharge Instructions    Call MD for:  redness, tenderness, or signs of infection (pain, swelling, bleeding, redness, odor or green/yellow discharge around incision site)    Complete by:  As directed    Call MD for:  severe or increased pain, loss or decreased feeling  in  affected limb(s)    Complete by:  As directed    Call MD for:  temperature >100.5    Complete by:  As directed    Discharge instructions    Complete by:  As directed    Okay to shower  Okay to walk and do stairs in fact he should do this frequently.  Continue to use 3 L home O2 for the next day or 2 and check your oxygen level as you normally would.  Okay to wean back to nighttime use only once you are consistently greater than 92 without oxygen   Driving Restrictions    Complete by:  As directed    No driving for 1 week   Lifting restrictions    Complete by:  As directed    No lifting for 2 weeks   Resume  previous diet    Complete by:  As directed      Allergies as of 02/20/2017   No Known Allergies     Medication List    TAKE these medications   albuterol 108 (90 Base) MCG/ACT inhaler Commonly known as:  PROVENTIL HFA;VENTOLIN HFA Inhale 2 puffs into the lungs every 4 (four) hours as needed for wheezing or shortness of breath.   aspirin EC 81 MG tablet Take 81 mg by mouth daily.   carvedilol 6.25 MG tablet Commonly known as:  COREG Take 6.25 mg by mouth 2 (two) times daily.   feeding supplement (GLUCERNA SHAKE) Liqd Take 237 mLs by mouth 2 (two) times daily between meals. What changed:  when to take this   Fluticasone-Salmeterol 250-50 MCG/DOSE Aepb Commonly known as:  ADVAIR Inhale 1 puff into the lungs 2 (two) times daily.   glimepiride 4 MG tablet Commonly known as:  AMARYL Take 4 mg by mouth 2 (two) times daily.   Loratadine 10 MG Caps Take 1 capsule by mouth daily as needed.   metolazone 5 MG tablet Commonly known as:  ZAROXOLYN Take 5 mg by mouth once weekly   NOVOLIN 70/30 RELION (70-30) 100 UNIT/ML injection Generic drug:  insulin NPH-regular Human Inject 5 Units into the skin 2 (two) times daily with a meal.   PRESERVISION AREDS 2 PO Take 1 capsule by mouth 2 (two) times daily.   TESTOSTERONE TD Apply topically once daily. 5% cream   torsemide 20 MG tablet Commonly known as:  DEMADEX TAKE 1 TABLET(20 MG) BY MOUTH EVERY DAY   Vitamin D3 2000 units capsule Take 1,000 Units by mouth daily.   vitamin E 1000 UNIT capsule Generic drug:  vitamin E Take 400 Units by mouth daily.      Verbal and written Discharge instructions given to the patient. Wound care per Discharge AVS Follow-up Information    Schnier, Latina CraverGregory G, MD Follow up in 1 week(s).   Specialties:  Vascular Surgery, Cardiology, Radiology, Vascular Surgery Why:  no studies Contact information: [address] [place] [address] [phone]           Signed: Levora DredgeGregory  Schnier, MD  02/20/2017, 1:57 PM

## 2017-02-20 NOTE — Progress Notes (Signed)
Pt was discharged; had been given discharge summary, PIV's removed, cardiac monitoring removed, and pt's personal belongings had been gathered. Pt stated he wanted to get dressed with the help of his wife. This RN stepped out of the room to allow pt to get dressed in privacy. Wife stepped out of room and told Staci, RN that pt "is on the floor and needs help". Upon entering the room, I heard the toilet flushing and pt noted to be on the floor beside the portable computer. Pt's home O2 noted to be wrapped around pt's home oxygen tank and not on pt.  Pt noted to be ashen in color, unresponsive, and agonal breathing. Pt moved to bed, noted to be pulseless and apneic. CPR started and code blue called. Code blue lasted approx 4-6 prior to ROSC. Dr. Belia HemanKasa and Dr. Gilda CreaseSchnier notified of same. See code sheet for further information.

## 2017-02-20 NOTE — ED Provider Notes (Signed)
[facility] Department of Emergency Medicine   Code Blue CONSULT NOTE  Chief Complaint: Cardiac arrest/unresponsive   Level V Caveat: Unresponsive  History of present illness: I was contacted by the hospital for a CODE BLUE cardiac arrest upstairs and presented to the patient's bedside.    ROS: Unable to obtain, Level V caveat  Scheduled Meds: Continuous Infusions: PRN Meds:. Past Medical History:  Diagnosis Date  . CAD (coronary artery disease)   . CKD (chronic kidney disease)   . COPD (chronic obstructive pulmonary disease) ([place])   . Diabetes mellitus with neuropathy ([place])   . DM type 2 (diabetes mellitus, type 2) ([place])   . Hypertension    Past Surgical History:  Procedure Laterality Date  . ABDOMINAL AORTIC ANEURYSM REPAIR    . CHOLECYSTECTOMY    . CORONARY ARTERY BYPASS GRAFT    . RENAL ARTERY STENT     Social History   Social History  . Marital status: Married    Spouse name: N/A  . Number of children: N/A  . Years of education: N/A   Occupational History  . Not on file.   Social History Main Topics  . Smoking status: Former Research scientist (life sciences)  . Smokeless tobacco: Never Used  . Alcohol use No  . Drug use: No  . Sexual activity: Not on file   Other Topics Concern  . Not on file   Social History Narrative  . No narrative on file   No Known Allergies  Last set of Vital Signs (not current) Vitals:   07/18/16 1036  BP: 123/63  Pulse: 83  Resp: 16  Temp: 97.6 F (36.4 C)  SpO2: 93%      Physical Exam  Gen: unresponsive Cardiovascular: pulseless  Resp: apneic. Breath sounds equal bilaterally with bagging  Abd: nondistended  Neuro: GCS 3, unresponsive to pain  HEENT: obvious head trauma to right forehead Neck: No crepitus  Musculoskeletal: No deformity  Skin: warm  Procedures  INTUBATION Performed by: Darel Hong Required items: required blood products, implants, devices, and special equipment available Patient identity  confirmed: provided demographic data and hospital-assigned identification number Time out: Immediately prior to procedure a "time out" was called to verify the correct patient, procedure, equipment, support staff and site/side marked as required. Indications: hypoxia and respiratory distress s/p ROSC Intubation method: mac 4 with 8-0 ET tube Preoxygenation: BVM Sedatives: 20 mg of etomidate Paralytic: 80 mg of rocuronium Tube Size: 8-0 cuffed Post-procedure assessment: chest rise and ETCO2 monitor Breath sounds: equal and absent over the epigastrium Tube secured by Respiratory Therapy Patient tolerated the procedure well with no immediate complications.  CRITICAL CARE Performed by: Darel Hong Total critical care time: 35 Critical care time was exclusive of separately billable procedures and treating other patients. Critical care was necessary to treat or prevent imminent or life-threatening deterioration. Critical care was time spent personally by me on the following activities: development of treatment plan with patient and/or surrogate as well as nursing, discussions with consultants, evaluation of patient's response to treatment, examination of patient, obtaining history from patient or surrogate, ordering and performing treatments and interventions, ordering and review of laboratory studies, ordering and review of radiographic studies, pulse oximetry and re-evaluation of patient's condition.  Cardiopulmonary Resuscitation (CPR) Procedure Note  Directed/Performed by: Darel Hong I personally directed ancillary staff and/or performed CPR in an effort to regain return of spontaneous circulation and to maintain cardiac, neuro and systemic perfusion.    Medical Decision making  When I  arrived to the intensive care unit the patient had CPR in progress.  He is an 83 year old man who is being discharged after a carotid endarterectomy.  Apparently as he was getting his clothes on he felt  weak and fell to the ground striking his forehead.  Nurses were then called to the bedside and the patient was pulseless.  When I arrived CPR was in progress and he had no IV access.  We performed roughly 3 minutes of chest compressions while he was being bagged easily by respiratory therapy and achieved return of spontaneous circulation.  The patient began to wake up and was able to speak and move all 4 extremities, although he was in obvious respiratory distress with rhonchorous breath sounds and saturating 80% despite nonrebreather.  EKG performed which was not a STEMI.  Decision was made to intubate and I used rocuronium given unclear potassium status.  He was intubated without complication.  Stat head CT ordered.  Intensivist was notified and care was transitioned.   ED ECG REPORT I, Darel Hong, the attending physician, personally viewed and interpreted this ECG.  Date: 02/20/2017 EKG Time: 1512 Rate: 86 Rhythm: normal sinus rhythm QRS Axis: normal Intervals: normal ST/T Wave abnormalities: V4 V5 V6 with ST depression Narrative Interpretation: Normal sinus rhythm at 86 with left bundle branch block.  No ST elevation but does appear to have some flat ST depression V4 V5 V6 consistent with subendocardial ischemia     Darel Hong, MD 02/20/17 1531

## 2017-02-20 NOTE — Consult Note (Signed)
Name: Kenneth Massey Currier MRN: [id number] DOB: 03-11-1930     CONSULTATION DATE: Aug 08, 2016  REFERRING MD : Lorretta HarpSchneir  CHIEF COMPLAINT:  Acute cardiac arrest    PROCEDURE:   1.  Left carotid endarterectomy with CorMatrix arterial patch reconstruction  PRE-OPERATIVE DIAGNOSIS: 1.  Critical carotid stenosis 2.  Diabetes; coronary artery disease   HISTORY OF PRESENT ILLNESS:  83 year old white male admitted to the stepdown unit initially for status post left carotid endarterectomy for critical carotid stenosis Patient was being prepped for discharge and prior to discharge patient was alert and awake following commands no apparent distress There is report that patient was ambulating to the toilet and upon completion patient had probable vasovagal syncope and then subsequently fell to the floor and he had acute cardiac arrest PEA for approximately 5-6 minutes CODE BLUE was initiated ACLS protocol started and upon further assessment patient had increased work of breathing after regaining pulse and patient was intubated  Currently patient is intubated and sedated on full ventilatory support FiO2 at 100% Patient is critically ill with acute severe respiratory failure etiology is unclear at this time but however acute MI and sudden cardiac death is probable  PAST MEDICAL HISTORY :   has a past medical history of CAD (coronary artery disease); CKD (chronic kidney disease); COPD (chronic obstructive pulmonary disease) (HCC); Diabetes mellitus with neuropathy (HCC); DM type 2 (diabetes mellitus, type 2) (HCC); and Hypertension.  has a past surgical history that includes Coronary artery bypass graft; Cholecystectomy; Abdominal aortic aneurysm repair; and Renal artery stent. Prior to Admission medications   Medication Sig Start Date End Date Taking? Authorizing Provider  albuterol (PROVENTIL HFA;VENTOLIN HFA) 108 (90 BASE) MCG/ACT inhaler Inhale 2 puffs into the lungs every 4 (four) hours as  needed for wheezing or shortness of breath. Patient not taking: Reported on 02/15/2017 09/24/14  Yes Hassan RowanWade, Eugene, MD  aspirin EC 81 MG tablet Take 81 mg by mouth daily.   Yes [provider]  carvedilol (COREG) 6.25 MG tablet Take 6.25 mg by mouth 2 (two) times daily.   Yes [provider]  Cholecalciferol (VITAMIN D3) 2000 UNITS capsule Take 1,000 Units by mouth daily.    Yes [provider]  feeding supplement, GLUCERNA SHAKE, (GLUCERNA SHAKE) LIQD Take 237 mLs by mouth 2 (two) times daily between meals. Patient taking differently: Take 237 mLs by mouth daily.  04/25/15  Yes Mody, Patricia PesaSital, MD  Fluticasone-Salmeterol (ADVAIR) 250-50 MCG/DOSE AEPB Inhale 1 puff into the lungs 2 (two) times daily.   Yes [provider]  glimepiride (AMARYL) 4 MG tablet Take 4 mg by mouth 2 (two) times daily.   Yes [provider]  insulin NPH-regular Human (NOVOLIN 70/30 RELION) (70-30) 100 UNIT/ML injection Inject 5 Units into the skin 2 (two) times daily with a meal.   Yes [provider]  Loratadine 10 MG CAPS Take 1 capsule by mouth daily as needed.   Yes [provider]  metolazone (ZAROXOLYN) 5 MG tablet Take 5 mg by mouth once weekly 05/12/16  Yes [provider]  Multiple Vitamins-Minerals (PRESERVISION AREDS 2 PO) Take 1 capsule by mouth 2 (two) times daily.    Yes [provider]  TESTOSTERONE TD Apply topically once daily. 5% cream   Yes [provider]  torsemide (DEMADEX) 20 MG tablet TAKE 1 TABLET(20 MG) BY MOUTH EVERY DAY 05/22/16  Yes [provider]  vitamin Massey (VITAMIN Massey) 1000 UNIT capsule Take 400 Units by mouth daily.  Yes [provider]   No Known Allergies  FAMILY HISTORY:  family history includes Hypertension in his unknown relative. SOCIAL HISTORY:  reports that he has quit smoking. He has never used smokeless tobacco. He reports that he does not drink alcohol or use drugs.  REVIEW  OF SYSTEMS:   Unable to provide due to critical illness     VITAL SIGNS: Temp:  [97.6 F (36.4 C)-98.9 F (37.2 C)] 98.9 F (37.2 C) (10/27 0800) Pulse Rate:  [52-78] 74 (10/27 1100) Resp:  [14-24] 23 (10/27 1100) BP: (73-124)/(37-64) 117/62 (10/27 1100) SpO2:  [88 %-99 %] 92 % (10/27 1100) Arterial Line BP: (106-163)/(47-64) 144/54 (10/27 0600) FiO2 (%):  [100 %] 100 % (10/27 1520)  Physical Examination:  GENERAL:critically ill appearing, +resp distress HEAD: Normocephalic, atraumatic.  EYES: Pupils equal, round, reactive to light.  No scleral icterus.  MOUTH: Moist mucosal membrane. NECK: Supple. No thyromegaly. No nodules. No JVD.  PULMONARY: +rhonchi CARDIOVASCULAR: S1 and S2. Regular rate and rhythm. No murmurs, rubs, or gallops.  GASTROINTESTINAL: Soft, nontender, -distended. No masses. Positive bowel sounds. No hepatosplenomegaly.  MUSCULOSKELETAL: No swelling, clubbing, or edema.  NEUROLOGIC: obtunded SKIN:intact,warm,dry        Recent Labs Lab 02/15/17 1133 02/20/17 0552 02/20/17 1508  NA 139 136 138  K 3.5 4.4 4.0  CL 98* 104 105  CO2 30 25 23   BUN 42* 38* 36*  CREATININE 1.98* 1.88* 2.12*  GLUCOSE 232* 261* 180*    Recent Labs Lab 02/15/17 1133 02/20/17 0552 02/20/17 1508  HGB 16.2 13.7 15.6  HCT 49.2 41.2 47.6  WBC 7.1 7.5 11.7*  PLT 160 121* 136*   Dg Abd 1 View  Result Date: 02/20/2017 CLINICAL DATA:  OG tube placement EXAM: ABDOMEN - 1 VIEW COMPARISON:  05/26/2010 FINDINGS: Small pleural effusions. Esophageal tube tip and side-port project over the proximal to mid stomach. Right upper quadrant surgical clips. Visible gas pattern is unremarkable. Aortoiliac vascular stent. IMPRESSION: 1. Esophageal tube tip overlies the mid stomach 2. Visible gas pattern is unremarkable 3. Small pleural effusions Electronically Signed   By: Jasmine Pang M.D.   On: 02/20/2017 16:15   Dg Chest Port 1 View  Result Date: 02/20/2017 CLINICAL DATA:  Post  CPR EXAM: PORTABLE CHEST 1 VIEW COMPARISON:  05/03/2015 FINDINGS: Post sternotomy changes. Endotracheal tube tip is about 4 cm superior to the carina. Esophageal tube extends below diaphragm but the tip is non included. Tiny pleural effusions. Borderline to mild cardiomegaly. Mild diffuse interstitial prominence suggesting mild edema. Mild bibasilar atelectasis. IMPRESSION: 1. Endotracheal tube tip is about 4 cm superior to carina 2. Small pleural effusions and hazy bibasilar atelectasis 3. Borderline to mild cardiomegaly. Mild diffuse interstitial prominence may relate to chronic change although mild edema difficult to exclude. Electronically Signed   By: Jasmine Pang M.D.   On: 02/20/2017 16:14   I have Independently reviewed images of  CXR   on 02/20/2017 Interpretation:b/l infiltrates  ASSESSMENT / PLAN: 83 year old white male with status post carotid endarterectomy with acute cardiac arrest and severe respiratory failure likely causes acute sudden cardiac death versus acute arrhythmia versus acute MI versus acute PE versus acute stroke or a combination of any condition stated  1.Respiratory Failure -continue Full MV support -continue Bronchodilator Therapy -Wean Fio2 and PEEP as tolerated abg pending  2.Cardiac arrest Check CE Check ECHO  3.follow up labs  4.place CVL  Family at bedside and updated    Critical Care Time devoted to patient  care services described in this note is 55 minutes.   Overall, patient is critically ill, prognosis is guarded.  Patient with Multiorgan failure and at high risk for cardiac arrest and death.    Lucie Leather, M.D.  [facility]  Medical Director [facility] Medical Director [facility] Cardio-Pulmonary Department

## 2017-02-21 LAB — BLOOD GAS, ARTERIAL
ACID-BASE DEFICIT: 3.4 mmol/L — AB (ref 0.0–2.0)
Acid-Base Excess: 4 mmol/L — ABNORMAL HIGH (ref 0.0–2.0)
BICARBONATE: 24.3 mmol/L (ref 20.0–28.0)
Bicarbonate: 33.4 mmol/L — ABNORMAL HIGH (ref 20.0–28.0)
FIO2: 1
FIO2: 1
LHR: 20 {breaths}/min
MECHVT: 500 mL
O2 SAT: 85.9 %
O2 SAT: 93.4 %
PATIENT TEMPERATURE: 37
PATIENT TEMPERATURE: 37
PCO2 ART: 71 mmHg — AB (ref 32.0–48.0)
PCO2 ART: 53 mmHg — AB (ref 32.0–48.0)
PEEP: 5 cmH2O
PEEP: 5 cmH2O
PH ART: 7.27 — AB (ref 7.350–7.450)
PO2 ART: 78 mmHg — AB (ref 83.0–108.0)
RATE: 20 resp/min
VT: 500 mL
pH, Arterial: 7.28 — ABNORMAL LOW (ref 7.350–7.450)
pO2, Arterial: 58 mmHg — ABNORMAL LOW (ref 83.0–108.0)

## 2017-02-21 LAB — URINALYSIS, ROUTINE W REFLEX MICROSCOPIC
Bilirubin Urine: NEGATIVE
Glucose, UA: NEGATIVE mg/dL
Ketones, ur: NEGATIVE mg/dL
Nitrite: NEGATIVE
PH: 5 (ref 5.0–8.0)
Protein, ur: 100 mg/dL — AB
SPECIFIC GRAVITY, URINE: 1.015 (ref 1.005–1.030)

## 2017-02-21 LAB — BASIC METABOLIC PANEL
Anion gap: 11 (ref 5–15)
BUN: 40 mg/dL — AB (ref 6–20)
CALCIUM: 8.1 mg/dL — AB (ref 8.9–10.3)
CO2: 21 mmol/L — AB (ref 22–32)
CREATININE: 2.42 mg/dL — AB (ref 0.61–1.24)
Chloride: 103 mmol/L (ref 101–111)
GFR calc non Af Amer: 23 mL/min — ABNORMAL LOW (ref >60)
GFR, EST AFRICAN AMERICAN: 26 mL/min — AB (ref >60)
Glucose, Bld: 145 mg/dL — ABNORMAL HIGH (ref 65–99)
Potassium: 4.6 mmol/L (ref 3.5–5.1)
Sodium: 135 mmol/L (ref 135–145)

## 2017-02-21 LAB — GLUCOSE, CAPILLARY
GLUCOSE-CAPILLARY: 212 mg/dL — AB (ref 65–99)
GLUCOSE-CAPILLARY: 201 mg/dL — AB (ref 65–99)
Glucose-Capillary: 120 mg/dL — ABNORMAL HIGH (ref 65–99)
Glucose-Capillary: 164 mg/dL — ABNORMAL HIGH (ref 65–99)

## 2017-02-21 LAB — TRIGLYCERIDES: Triglycerides: 124 mg/dL (ref <150)

## 2017-02-21 MED ORDER — VASOPRESSIN 20 UNIT/ML IV SOLN
0.0300 [IU]/min | INTRAVENOUS | Status: DC
  Administered 2017-02-21 – 2017-02-22 (×2): 0.03 [IU]/min via INTRAVENOUS
  Filled 2017-02-21 (×2): qty 2

## 2017-02-21 MED ORDER — AMIODARONE HCL IN DEXTROSE 360-4.14 MG/200ML-% IV SOLN
INTRAVENOUS | Status: AC
  Filled 2017-02-21: qty 200

## 2017-02-21 MED ORDER — AMIODARONE HCL IN DEXTROSE 360-4.14 MG/200ML-% IV SOLN
30.0000 mg/h | INTRAVENOUS | Status: DC
  Administered 2017-02-21 – 2017-02-22 (×2): 30 mg/h via INTRAVENOUS
  Filled 2017-02-21: qty 200

## 2017-02-21 MED ORDER — PROPOFOL 1000 MG/100ML IV EMUL
5.0000 ug/kg/min | INTRAVENOUS | Status: DC
  Administered 2017-02-21: 01:00:00 via INTRAVENOUS
  Administered 2017-02-21: 5 ug/kg/min via INTRAVENOUS
  Filled 2017-02-21: qty 100

## 2017-02-21 MED ORDER — ATROPINE SULFATE 1 MG/ML IJ SOLN
1.0000 mg | Freq: Once | INTRAMUSCULAR | Status: AC
  Administered 2017-02-21: 1 mg via INTRAVENOUS

## 2017-02-21 MED ORDER — SODIUM BICARBONATE 8.4 % IV SOLN
100.0000 meq | Freq: Once | INTRAVENOUS | Status: AC
  Administered 2017-02-21: 100 meq via INTRAVENOUS

## 2017-02-21 MED ORDER — PROPOFOL 1000 MG/100ML IV EMUL
INTRAVENOUS | Status: AC
  Filled 2017-02-21: qty 100

## 2017-02-21 MED ORDER — AMIODARONE HCL IN DEXTROSE 360-4.14 MG/200ML-% IV SOLN
60.0000 mg/h | INTRAVENOUS | Status: AC
  Administered 2017-02-21: 60 mg/h via INTRAVENOUS
  Filled 2017-02-21: qty 200

## 2017-02-21 MED ORDER — SODIUM BICARBONATE 650 MG PO TABS
650.0000 mg | ORAL_TABLET | Freq: Four times a day (QID) | ORAL | Status: DC
  Administered 2017-02-21 (×4): 650 mg
  Filled 2017-02-21 (×6): qty 1

## 2017-02-21 NOTE — Progress Notes (Signed)
Pt currently on Fentanyl, propofol, vasopressin, levophed and amiodarone gtts. Vent: 85%/ rate 24/ 5p/ TV 500. At 0745, pt had sudden drop in bp and then became bradycardic. Sedation was immediately turned off and levophed increased. Dr Belia HemanKasa here and 1 amp atropine and 2 amps Bicarb were given.  Vasopressin gtt added. Runs PAT and PVC's on monitor. Amiodarone gtt without bolus started.  At 0930, pt aroused to follow commands:  stick out tongue, grip hands, hold one finger up, wiggle toes. He then became agitated, sitting up, gagging, and dyspneic.  Fentanyl and propofol restarted. UOP is minimal 57ml for past 10hrs.

## 2017-02-21 NOTE — Consult Note (Signed)
Date: 02/21/2017                  Patient Name:  Kenneth Massey  MRN: [id number]  DOB: 01-19-1930  Age / Sex: 83 y.o., male         PCP: Tracie Harrier, MD                 Service Requesting Consult: ICU Dr Orest Dikes, Dolores Lory, MD                 Reason for Consult: ARF            History of Present Illness: Patient is a 83 y.o. male with medical problems of COPD, hypertension, type 2 diabetes, chronic kidney disease, coronary artery disease, who was admitted to [facility] on 02/01/2017.  He underwent left carotid endarterectomy on October 26.  He had greater than 90% stenosis by duplex ultrasound.  His surgery was uneventful.  On the day of discharge, patient stood up and collapsed.  A CODE BLUE was called.  Patient underwent ACLS and cardiac resuscitation.  He is currently now intubated and is requiring ventilator support.  Baseline creatinine appears to be 1.88 from October 27/GFR 31 It has increased to 2.42 today Urine output last 24 hours was 615 cc.  Patient currently has a Foley catheter  Medications: Outpatient medications: Facility-Administered Medications Prior to Admission  Medication Dose Route Frequency Provider Last Rate Last Dose  . vancomycin (VANCOCIN) 1,000 mg in sodium chloride 0.9 % 500 mL IVPB  1,000 mg Intravenous Once Stegmayer, Janalyn Harder, PA-C       Prescriptions Prior to Admission  Medication Sig Dispense Refill Last Dose  . albuterol (PROVENTIL HFA;VENTOLIN HFA) 108 (90 BASE) MCG/ACT inhaler Inhale 2 puffs into the lungs every 4 (four) hours as needed for wheezing or shortness of breath. (Patient not taking: Reported on 02/15/2017) 1 Inhaler 1 Not Taking at Unknown time  . aspirin EC 81 MG tablet Take 81 mg by mouth daily.   02/18/2017 at Unknown time  . carvedilol (COREG) 6.25 MG tablet Take 6.25 mg by mouth 2 (two) times daily.   02/16/2017 at Unknown time  . Cholecalciferol (VITAMIN D3) 2000 UNITS capsule Take 1,000 Units by mouth daily.     02/18/2017 at Unknown time  . feeding supplement, GLUCERNA SHAKE, (GLUCERNA SHAKE) LIQD Take 237 mLs by mouth 2 (two) times daily between meals. (Patient taking differently: Take 237 mLs by mouth daily. ) 30 Can 0 02/18/2017 at Unknown time  . Fluticasone-Salmeterol (ADVAIR) 250-50 MCG/DOSE AEPB Inhale 1 puff into the lungs 2 (two) times daily.   01/30/2017 at Unknown time  . glimepiride (AMARYL) 4 MG tablet Take 4 mg by mouth 2 (two) times daily.   02/18/2017 at Unknown time  . insulin NPH-regular Human (NOVOLIN 70/30 RELION) (70-30) 100 UNIT/ML injection Inject 5 Units into the skin 2 (two) times daily with a meal.   02/17/2017  . Loratadine 10 MG CAPS Take 1 capsule by mouth daily as needed.   02/18/2017 at Unknown time  . metolazone (ZAROXOLYN) 5 MG tablet Take 5 mg by mouth once weekly   02/12/2017  . Multiple Vitamins-Minerals (PRESERVISION AREDS 2 PO) Take 1 capsule by mouth 2 (two) times daily.    02/18/2017 at Unknown time  . TESTOSTERONE TD Apply topically once daily. 5% cream   Taking  . torsemide (DEMADEX) 20 MG tablet TAKE 1 TABLET(20 MG) BY MOUTH EVERY DAY   02/18/2017 at Unknown  time  . vitamin E (VITAMIN E) 1000 UNIT capsule Take 400 Units by mouth daily.    02/12/2017    Current medications: Current Facility-Administered Medications  Medication Dose Route Frequency Provider Last Rate Last Dose  . 0.9 %  sodium chloride infusion  500 mL Intravenous Once PRN Schnier, Dolores Lory, MD      . acetaminophen (TYLENOL) tablet 325-650 mg  325-650 mg Oral Q4H PRN Schnier, Dolores Lory, MD       Or  . acetaminophen (TYLENOL) suppository 325-650 mg  325-650 mg Rectal Q4H PRN Schnier, Dolores Lory, MD      . albuterol (PROVENTIL) (2.5 MG/3ML) 0.083% nebulizer solution 3 mL  3 mL Inhalation Q4H PRN Schnier, Dolores Lory, MD      . alum & mag hydroxide-simeth (MAALOX/MYLANTA) 200-200-20 MG/5ML suspension 15-30 mL  15-30 mL Oral Q2H PRN Schnier, Dolores Lory, MD      . amiodarone (NEXTERONE PREMIX) 360-4.14  MG/200ML-% (1.8 mg/mL) IV infusion  60 mg/hr Intravenous Continuous Flora Lipps, MD 33.3 mL/hr at 02/21/17 0900 60 mg/hr at 02/21/17 0900  . amiodarone (NEXTERONE PREMIX) 360-4.14 MG/200ML-% (1.8 mg/mL) IV infusion  30 mg/hr Intravenous Continuous Flora Lipps, MD      . aspirin EC tablet 81 mg  81 mg Oral Daily Schnier, Dolores Lory, MD   81 mg at 02/20/17 1001  . chlorhexidine gluconate (MEDLINE KIT) (PERIDEX) 0.12 % solution 15 mL  15 mL Mouth Rinse BID Flora Lipps, MD   15 mL at 02/21/17 0730  . cholecalciferol (VITAMIN D) tablet 1,000 Units  1,000 Units Oral Daily Schnier, Dolores Lory, MD   1,000 Units at 02/20/17 1001  . dextrose 5 % and 0.9 % NaCl with KCl 20 mEq/L infusion   Intravenous Continuous Schnier, Dolores Lory, MD 75 mL/hr at 02/21/17 0700    . docusate sodium (COLACE) capsule 100 mg  100 mg Oral Daily Schnier, Dolores Lory, MD   100 mg at 02/20/17 1001  . fentaNYL 2555mg in NS 2572m(105mml) infusion-PREMIX  200 mcg/hr Intravenous Continuous RifDarel HongD 17.5 mL/hr at 02/21/17 1006 175 mcg/hr at 02/21/17 1006  . guaiFENesin-dextromethorphan (ROBITUSSIN DM) 100-10 MG/5ML syrup 15 mL  15 mL Oral Q4H PRN Schnier, GreDolores LoryD      . hydrALAZINE (APRESOLINE) injection 5 mg  5 mg Intravenous Q20 Min PRN Schnier, GreDolores LoryD      . insulin aspart (novoLOG) injection 0-15 Units  0-15 Units Subcutaneous TID WC Schnier, GreDolores LoryD   8 Units at 02/20/17 1228  . labetalol (NORMODYNE,TRANDATE) injection 10 mg  10 mg Intravenous Q10 min PRN Schnier, GreDolores LoryD      . MEDLINE mouth rinse  15 mL Mouth Rinse 10 times per day KasFlora LippsD   15 mL at 02/21/17 0544  . metoprolol tartrate (LOPRESSOR) injection 2-5 mg  2-5 mg Intravenous Q2H PRN Schnier, GreDolores LoryD      . morphine 2 MG/ML injection 2-5 mg  2-5 mg Intravenous Q1H PRN Schnier, GreDolores LoryD      . norepinephrine (LEVOPHED) 4 mg in dextrose 5 % 250 mL (0.016 mg/mL) infusion  0-40 mcg/min Intravenous Titrated KasFlora LippsMD 30 mL/hr at 02/21/17 1000 8 mcg/min at 02/21/17 1000  . ondansetron (ZOFRAN) injection 4 mg  4 mg Intravenous Q6H PRN Schnier, GreDolores LoryD      . oxyCODONE (Oxy IR/ROXICODONE) immediate release tablet 5-10 mg  5-10 mg Oral Q4H PRN Schnier, GreDolores LoryD  5 mg at 02/20/17 0755  . pantoprazole (PROTONIX) injection 40 mg  40 mg Intravenous QHS Schnier, Dolores Lory, MD   40 mg at 02/20/17 2210  . phenol (CHLORASEPTIC) mouth spray 1 spray  1 spray Mouth/Throat PRN Schnier, Dolores Lory, MD      . propofol (DIPRIVAN) 1000 MG/100ML infusion  5-80 mcg/kg/min Intravenous Titrated Dorene Sorrow S, NP 2.3 mL/hr at 02/21/17 1000 5 mcg/kg/min at 02/21/17 1000  . torsemide (DEMADEX) tablet 20 mg  20 mg Oral Daily Schnier, Dolores Lory, MD   20 mg at 02/20/17 1001  . vasopressin (PITRESSIN) 40 Units in sodium chloride 0.9 % 250 mL (0.16 Units/mL) infusion  0.03 Units/min Intravenous Continuous Flora Lipps, MD 11.3 mL/hr at 02/21/17 0903 0.03 Units/min at 02/21/17 0903  . vitamin E capsule 400 Units  400 Units Oral Daily Schnier, Dolores Lory, MD   400 Units at 02/20/17 1001      Allergies: No Known Allergies    Past Medical History: Past Medical History:  Diagnosis Date  . CAD (coronary artery disease)   . CKD (chronic kidney disease)   . COPD (chronic obstructive pulmonary disease) ([place])   . Diabetes mellitus with neuropathy ([place])   . DM type 2 (diabetes mellitus, type 2) ([place])   . Hypertension      Past Surgical History: Past Surgical History:  Procedure Laterality Date  . ABDOMINAL AORTIC ANEURYSM REPAIR    . CHOLECYSTECTOMY    . CORONARY ARTERY BYPASS GRAFT    . RENAL ARTERY STENT       Family History: Family History  Problem Relation Age of Onset  . Hypertension Unknown      Social History: Social History   Social History  . Marital status: Married    Spouse name: N/A  . Number of children: N/A  . Years of education: N/A   Occupational History  . Not on file.   Social  History Main Topics  . Smoking status: Former Research scientist (life sciences)  . Smokeless tobacco: Never Used  . Alcohol use No  . Drug use: No  . Sexual activity: Not on file   Other Topics Concern  . Not on file   Social History Narrative  . No narrative on file     Review of Systems: Not available as patient is currently on ventilator Gen:  HEENT:  CV:  Resp:  GI: GU :  MS:  Derm:   Psych: Heme:  Neuro:  Endocrine  Vital Signs: Blood pressure (!) 107/56, pulse 95, temperature (!) 100.7 F (38.2 C), temperature source Oral, resp. rate (!) 21, height 6' (1.829 m), weight 76.4 kg (168 lb 6.9 oz), SpO2 100 %.   Intake/Output Summary (Last 24 hours) at 02/21/17 1121 Last data filed at 02/21/17 1100  Gross per 24 hour  Intake          2564.32 ml  Output              615 ml  Net          1949.32 ml    Weight trends: [employer]   02/18/2017 1600  Weight: 76.4 kg (168 lb 6.9 oz)    Physical Exam: General:  Critically ill appearing  HEENT  ET tube in place, OGT  Neck:  Right IJ central line, left CEA scar  Lungs:  Ventilator assisted  Heart:: iregular, tachycardic  Abdomen: Soft, NT  Extremities:  no edema, SCDs in place  Neurologic: sedated  Skin: No acute rashes  Foley:  Present       Lab results: Basic Metabolic Panel:  Recent Labs Lab 02/20/17 0552 02/20/17 1508 02/21/17 0541  NA 136 138 135  K 4.4 4.0 4.6  CL 104 105 103  CO2 25 23 21*  GLUCOSE 261* 180* 145*  BUN 38* 36* 40*  CREATININE 1.88* 2.12* 2.42*  CALCIUM 7.9* 7.6* 8.1*  MG  --  1.7  --     Liver Function Tests:  Recent Labs Lab 02/20/17 1508  AST 69*  ALT 44  ALKPHOS 71  BILITOT 1.3*  PROT 6.3*  ALBUMIN 3.1*   No results for input(s): LIPASE, AMYLASE in the last 168 hours. No results for input(s): AMMONIA in the last 168 hours.  CBC:  Recent Labs Lab 02/15/17 1133 02/20/17 0552 02/20/17 1508  WBC 7.1 7.5 11.7*  NEUTROABS 4.7  --   --   HGB 16.2 13.7 15.6  HCT 49.2 41.2  47.6  MCV 91.5 91.0 93.4  PLT 160 121* 136*    Cardiac Enzymes:  Recent Labs Lab 02/20/17 1508  TROPONINI 0.04*    BNP: Invalid input(s): POCBNP  CBG:  Recent Labs Lab 02/17/2017 1509 02/21/2017 2139 02/20/17 0740 02/20/17 1056 02/21/17 0746  GLUCAP 220* 206* 239* 263* 120*    Microbiology: Recent Results (from the past 720 hour(s))  Surgical pcr screen     Status: Abnormal   Collection Time: 02/15/17 11:33 AM  Result Value Ref Range Status   MRSA, PCR NEGATIVE NEGATIVE Final   Staphylococcus aureus POSITIVE (A) NEGATIVE Final    Comment: (NOTE) The Xpert SA Assay (FDA approved for NASAL specimens in patients 68 years of age and older), is one component of a comprehensive surveillance program. It is not intended to diagnose infection nor to guide or monitor treatment.   MRSA PCR Screening     Status: None   Collection Time: 02/14/2017  7:26 PM  Result Value Ref Range Status   MRSA by PCR NEGATIVE NEGATIVE Final    Comment:        The GeneXpert MRSA Assay (FDA approved for NASAL specimens only), is one component of a comprehensive MRSA colonization surveillance program. It is not intended to diagnose MRSA infection nor to guide or monitor treatment for MRSA infections.      Coagulation Studies: No results for input(s): LABPROT, INR in the last 72 hours.  Urinalysis: No results for input(s): COLORURINE, LABSPEC, PHURINE, GLUCOSEU, HGBUR, BILIRUBINUR, KETONESUR, PROTEINUR, UROBILINOGEN, NITRITE, LEUKOCYTESUR in the last 72 hours.  Invalid input(s): APPERANCEUR      Imaging: Dg Abd 1 View  Result Date: 02/20/2017 CLINICAL DATA:  OG tube placement EXAM: ABDOMEN - 1 VIEW COMPARISON:  05/26/2010 FINDINGS: Small pleural effusions. Esophageal tube tip and side-port project over the proximal to mid stomach. Right upper quadrant surgical clips. Visible gas pattern is unremarkable. Aortoiliac vascular stent. IMPRESSION: 1. Esophageal tube tip overlies the mid  stomach 2. Visible gas pattern is unremarkable 3. Small pleural effusions Electronically Signed   By: Donavan Foil M.D.   On: 02/20/2017 16:15   Ct Head Wo Contrast  Result Date: 02/20/2017 CLINICAL DATA:  Cardiac arrest and CPR. Left carotid endarterectomy yesterday. EXAM: CT HEAD WITHOUT CONTRAST TECHNIQUE: Contiguous axial images were obtained from the base of the skull through the vertex without intravenous contrast. COMPARISON:  None available FINDINGS: Brain: No evidence of acute infarction, hemorrhage, hydrocephalus, extra-axial collection or mass lesion/mass effect. Chronic appearing lacunar infarct in the left posterior putamen/ corona radiata. O  brain atrophy. No evidence of generalized swelling or elevated intracranial pressure. Vascular: Atherosclerotic calcification.  No high-density vessel. Skull: No acute or aggressive finding. Sinuses/Orbits: Bilateral cataract resection.  No acute finding. IMPRESSION: Senescent changes without acute finding. Electronically Signed   By: Monte Fantasia M.D.   On: 02/20/2017 17:08   Dg Chest Port 1 View  Result Date: 02/20/2017 CLINICAL DATA:  83 year old male with central line placement. EXAM: PORTABLE CHEST 1 VIEW COMPARISON:  Earlier chest radiograph dated 02/20/2017 FINDINGS: There has been interval placement of a right IJ central line the tip over central SVC close to the cavoatrial junction. There is no pneumothorax. The endotracheal tube and enteric tubes appear in similar positioning. There is emphysematous changes of the lungs with diffuse interstitial coarsening. Small bilateral pleural effusions and bilateral lower lung field densities likely representing mild edema. Pneumonia is not excluded. There is top-normal cardiac size. Median sternotomy wires and CABG vascular clips noted. There is atherosclerotic calcification of the thoracic aorta. There is osteopenia with degenerative changes of the shoulders and spine. No acute osseous pathology.  IMPRESSION: 1. Interval placement of a right IJ central line the tip over central SVC. No pneumothorax. 2. Endotracheal and enteric tubes in similar position as prior. 3. Small bilateral pleural effusions and mild interstitial edema on a background of emphysema as seen on the prior radiograph. Electronically Signed   By: Anner Crete M.D.   On: 02/20/2017 18:28   Dg Chest Port 1 View  Result Date: 02/20/2017 CLINICAL DATA:  Post CPR EXAM: PORTABLE CHEST 1 VIEW COMPARISON:  05/03/2015 FINDINGS: Post sternotomy changes. Endotracheal tube tip is about 4 cm superior to the carina. Esophageal tube extends below diaphragm but the tip is non included. Tiny pleural effusions. Borderline to mild cardiomegaly. Mild diffuse interstitial prominence suggesting mild edema. Mild bibasilar atelectasis. IMPRESSION: 1. Endotracheal tube tip is about 4 cm superior to carina 2. Small pleural effusions and hazy bibasilar atelectasis 3. Borderline to mild cardiomegaly. Mild diffuse interstitial prominence may relate to chronic change although mild edema difficult to exclude. Electronically Signed   By: Donavan Foil M.D.   On: 02/20/2017 16:14      Assessment & Plan: Pt is a 83 y.o. Caucasian  male with  medical problems of COPD, hypertension, type 2 diabetes, chronic kidney disease, coronary artery disease, CABG, CHF, PAD, Renal artery stent, AAA repair who was admitted to [facility] on 01/28/2017 for left carotid endarterectomy which was uneventful however hospital course was complicated by cardiac arrest prior to discharge leading to acute respiratory failure requiring ventilator support  1.  Acute renal failure on chronic kidney disease stage III Baseline creatinine is 1.88/GFR 31 from February 20, 2017 Today's creatinine has increased to 2.42 and urine output appears to be decreasing Patient currently has a Foley catheter Maintain adequate volume status and hemodynamic support Electrolytes and volume status are  acceptable.  No acute indication for dialysis at present. Obtain urialysis  2.  Acute respiratory failure Currently on ventilator support FiO2 65 worsened to 100%

## 2017-02-21 NOTE — Consult Note (Signed)
Name: JANN RA MRN: [id number] DOB: 02-02-1930     CONSULTATION DATE: 2017-03-04  REFERRING MD : Lorretta Harp  CHIEF COMPLAINT:  Acute cardiac arrest    PROCEDURE:   1.  Left carotid endarterectomy with CorMatrix arterial patch reconstruction  PRE-OPERATIVE DIAGNOSIS: 1.  Critical carotid stenosis 2.  Diabetes; coronary artery disease   HISTORY OF PRESENT ILLNESS:  83 year old white male admitted to the stepdown unit initially for status post left carotid endarterectomy for critical carotid stenosis Patient was being prepped for discharge and prior to discharge patient was alert and awake following commands no apparent distress There is report that patient was ambulating to the toilet and upon completion patient had probable vasovagal syncope and then subsequently fell to the floor and he had acute cardiac arrest PEA for approximately 5-6 minutes CODE BLUE was initiated ACLS protocol started and upon further assessment patient had increased work of breathing after regaining pulse and patient was intubated  Subjective Currently patient is intubated and sedated on full ventilatory support FiO2 at 65% Patient is critically ill with acute severe respiratory failure etiology is unclear at this time but however acute MI and sudden cardiac death is probable Patient with near cardiac arrest this a.m. patient with drop in blood pressure and heart rate atropine 1 mg was given IV several doses of bicarb was also given patient now started on multiple vasopressors high risk for cardiac arrest and death   REVIEW OF SYSTEMS:   Unable to provide due to critical illness   VITAL SIGNS: Temp:  [97.9 F (36.6 C)-99.5 F (37.5 C)] 99.5 F (37.5 C) (10/28 0700) Pulse Rate:  [65-92] 65 (10/28 0750) Resp:  [16-37] 16 (10/28 0800) BP: (46-180)/(36-81) 180/72 (10/28 0800) SpO2:  [75 %-100 %] (P) 95 % (10/28 0800) FiO2 (%):  [65 %-100 %] (P) 100 % (10/28 0800)  Physical Examination:    GENERAL:critically ill appearing, +resp distress HEAD: Normocephalic, atraumatic.  EYES: Pupils equal, round, reactive to light.  No scleral icterus.  MOUTH: Moist mucosal membrane. NECK: Supple. No thyromegaly. No nodules. No JVD.  PULMONARY: +rhonchi CARDIOVASCULAR: S1 and S2. Regular rate and rhythm. No murmurs, rubs, or gallops.  GASTROINTESTINAL: Soft, nontender, -distended. No masses. Positive bowel sounds. No hepatosplenomegaly.  MUSCULOSKELETAL: No swelling, clubbing, or edema.  NEUROLOGIC: obtunded SKIN:intact,warm,dry        Recent Labs Lab 02/20/17 0552 02/20/17 1508 02/21/17 0541  NA 136 138 135  K 4.4 4.0 4.6  CL 104 105 103  CO2 25 23 21*  BUN 38* 36* 40*  CREATININE 1.88* 2.12* 2.42*  GLUCOSE 261* 180* 145*    Recent Labs Lab 02/15/17 1133 02/20/17 0552 02/20/17 1508  HGB 16.2 13.7 15.6  HCT 49.2 41.2 47.6  WBC 7.1 7.5 11.7*  PLT 160 121* 136*     ASSESSMENT / PLAN: 83 year old white male with status post carotid endarterectomy with acute cardiac arrest and severe respiratory failure likely causes acute sudden cardiac death versus acute arrhythmia versus acute MI/CHF versus acute PE versus acute stroke or a combination of any condition stated  1.Respiratory Failure -continue Full MV support -continue Bronchodilator Therapy -Wean Fio2 and PEEP as tolerated abg pending  2.Cardiac arrest Check CE Check ECHO  3.follow up labs  4.  Acute renal failure from ATN Continue Foley catheter Continue strict I's and O's Will need to consider nephrology consult  Patient now with multiorgan failure in the setting of cardiovascular collapse respiratory failure and acute renal failure  Critical Care Time  devoted to patient care services described in this note is 45 minutes.   Overall, patient is critically ill, prognosis is guarded.  Patient with Multiorgan failure and at high risk for cardiac arrest and death.  Will update family once they  arrive    Josedejesus Marcum Santiago Gladavid Pavan Bring, M.D.  [facility]  Medical Director [facility] Medical Director [facility]

## 2017-02-21 NOTE — Progress Notes (Signed)
Initial Nutrition Assessment  DOCUMENTATION CODES:   Not applicable  INTERVENTION:  Once patient is hemodynamically stable, recommend initiating Vital 1.5 at 25 mL/hr. After 8 hours can advance to goal regimen of Vital 1.5 at 50 mL/hr + Pro-Stat 30 mL daily via OGT. Goal regimen provides 1900 kcal, 96 grams of protein, 912 mL H2O daily.  NUTRITION DIAGNOSIS:   Inadequate oral intake related to inability to eat as evidenced by NPO status.  GOAL:   Provide needs based on ASPEN/SCCM guidelines  MONITOR:   Vent status, Labs, Weight trends, TF tolerance, Skin, I & O's  REASON FOR ASSESSMENT:   Ventilator    ASSESSMENT:   83 year old male with PMHx of COPD, HTN, DM type 2 with neuropathy, CAD s/p CABG, CKD with hx of renal artery stent, hx cholecystectomy and abdominal aortic aneurysm repair who was admitted on 10/26 for left carotid endarterectomy with CorMatrix arterial patch reconstruction in setting of critical carotid stenosis. On 10/27 patient was preparing for discharge and suffered probable vasovagal syncope and had PEA arrest, underwent ACLS for 4-6 minutes prior to ROSC. Patient was emergently intubated.   Patient currently intubated and sedated. No family members at bedside at this time. Discussed with RN. Code cart at bedside as patient had near cardiac arrest this AM requiring atropine and bicarb. Per review of weight history in chart patient has been 158-164 lbs this past year. Current weight may be falsely elevated in setting of edema.  Access: 16 Fr. OGT placed 10/27; terminates in stomach per abdominal x-ray 10/27; 65 cm at corner of mouth  MAP: 40-107 mmHg today (41-60 mmHg at 0700; has stabilized some since)  Patient is currently intubated on ventilator support MV: 10.2 L/min Temp (24hrs), Avg:99.1 F (37.3 C), Min:97.9 F (36.6 C), Max:100.7 F (38.2 C)  Propofol: 2.3 ml/hr (61 kcal daily)  Medications reviewed and include: vitamin D 1000 units daily,  Novolog 0-15 units TID, pantoprazole, torsemide 20 mg daily, amiodarone gtt, D5-NS with KCl 20 mEq/L @ 75 ml/hr (90 grams dextrose, 306 kcal daily), fentanyl gtt, Levophed gtt (had increased earlier this morning, now trending back down and at 30 mL/hr), propofol gtt, vasopressin gtt.  Labs reviewed: CBG 120, CO2 21, BUN 40, Creatinine 2.42.  I/O: 615 mL UOP yesterday (0.3 mL/kg/hr)  Patient does not meet criteria for malnutrition at this time.  NUTRITION - FOCUSED PHYSICAL EXAM:    Most Recent Value  Orbital Region  Mild depletion  Upper Arm Region  Mild depletion  Thoracic and Lumbar Region  No depletion  Buccal Region  Mild depletion  Temple Region  Mild depletion  Clavicle Bone Region  No depletion  Clavicle and Acromion Bone Region  No depletion  Scapular Bone Region  Unable to assess  Dorsal Hand  Unable to assess  Patellar Region  No depletion  Anterior Thigh Region  No depletion  Posterior Calf Region  No depletion  Edema (RD Assessment)  Mild [bilateral lower extremities]  Hair  Reviewed  Eyes  Unable to assess  Mouth  Unable to assess  Skin  Reviewed Rosine Abe[very dry]  Nails  Reviewed      Diet Order:  Diet Carb Modified Fluid consistency: Thin; Room service appropriate? Yes  EDUCATION NEEDS:   No education needs have been identified at this time  Skin:  Skin Assessment: Skin Integrity Issues: Skin Integrity Issues:: Incisions Incisions: closed incision left neck  Last BM:  02/20/2017  Height:   Ht Readings from Last 1 Encounters:  02/02/2017 6' (1.829 m)    Weight:   Wt Readings from Last 1 Encounters:  02/13/2017 168 lb 6.9 oz (76.4 kg)    Ideal Body Weight:  80.9 kg  BMI:  Body mass index is 22.84 kg/m.  Estimated Nutritional Needs:   Kcal:  1917 (PSU 2003b w/ MSJ 1493, Ve 10.2, Tmax 38.2)  Protein:  92-115 grams (1.2-1.5 grams/kg)  Fluid:  1.9 L/day (25 mL/kg)  Helane Rima, MS, RD, LDN Office: [phone] Pager: [phone] After  Hours/Weekend Pager: [phone]

## 2017-02-21 NOTE — Progress Notes (Signed)
After further discussion with family and assessing patients clinical status, patient has multiorgan failure with high risk for cardiac arrest. I met with patients wife and daughter, they understand his clinical condition.  They have all agreed and consented to DNR status. They stated that he would NOT want to suffer.  I have explained that we will continue medical management and will place DNR orders.    Family are satisfied with Plan of action and management. All questions answered  Kenneth Massey, M.D.  [facility]  Medical Director [place] Director [facility] Cardio-Pulmonary Department

## 2017-02-21 NOTE — Progress Notes (Signed)
[facility]  Daily Progress Note   Subjective  - 2 Days Post-Op  The patient was getting dressed to go home when a sudden cardiac arrest occurred.  He was resuscitated and intubated.  Over the past 24 hours he has been hemodynamically stable however he has become progressively more oliguric.  When sedation is stopped he does respond appropriate to commands and is moving all extremities.  Objective Vitals:   02/21/17 1030 02/21/17 1045 02/21/17 1100 02/21/17 1200  BP: (!) 116/58 (!) 111/53 (!) 107/56 (!) 105/53  Pulse: 98 95 95 86  Resp: (!) 21 (!) 24 (!) 21 (!) 24  Temp:      TempSrc:      SpO2: 100% 99% 100% 100%  Weight:      Height:        Intake/Output Summary (Last 24 hours) at 02/21/17 1253 Last data filed at 02/21/17 1200  Gross per 24 hour  Intake          2508.72 ml  Output              615 ml  Net          1893.72 ml    PULM  Normal effort , no use of accessory muscles CV  No JVD, RRR Abd      No distended, nontender VASC  neck incision clean dry and intact with mild amount of appropriate ecchymoses Laboratory CBC    Component Value Date/Time   WBC 11.7 (H) 02/20/2017 1508   HGB 15.6 02/20/2017 1508   HGB 10.9 (L) 06/25/2011 0451   HCT 47.6 02/20/2017 1508   HCT 33.4 (L) 06/25/2011 0451   PLT 136 (L) 02/20/2017 1508   PLT 100 (L) 06/25/2011 0451    BMET    Component Value Date/Time   NA 135 02/21/2017 0541   NA 139 08/03/2011 0914   K 4.6 02/21/2017 0541   K 4.5 08/03/2011 0914   CL 103 02/21/2017 0541   CL 103 08/03/2011 0914   CO2 21 (L) 02/21/2017 0541   CO2 29 08/03/2011 0914   GLUCOSE 145 (H) 02/21/2017 0541   GLUCOSE 209 (H) 08/03/2011 0914   BUN 40 (H) 02/21/2017 0541   BUN 17 08/03/2011 0914   CREATININE 2.42 (H) 02/21/2017 0541   CREATININE 1.36 (H) 08/03/2011 0914   CALCIUM 8.1 (L) 02/21/2017 0541   CALCIUM 9.1 08/03/2011 0914   GFRNONAA 23 (L) 02/21/2017 0541   GFRNONAA 54 (L) 08/03/2011 0914   GFRAA 26  (L) 02/21/2017 0541   GFRAA >60 08/03/2011 0914    Assessment/Planning: POD #2 s/p left carotid endarterectomy  Continue supportive care as the patient appears to be neurologically intact and appropriate, family has now made the patient DNR.  Whether or not dialysis will need to be initiated will be determined over the next day or so.   Levora DredgeGregory Schnier  02/21/2017, 12:53 PM

## 2017-02-22 ENCOUNTER — Encounter: Payer: Self-pay | Admitting: Vascular Surgery

## 2017-02-22 DIAGNOSIS — J9690 Respiratory failure, unspecified, unspecified whether with hypoxia or hypercapnia: Secondary | ICD-10-CM

## 2017-02-22 DIAGNOSIS — E872 Acidosis, unspecified: Secondary | ICD-10-CM

## 2017-02-22 DIAGNOSIS — I469 Cardiac arrest, cause unspecified: Secondary | ICD-10-CM

## 2017-02-22 LAB — BASIC METABOLIC PANEL
Anion gap: 14 (ref 5–15)
BUN: 62 mg/dL — AB (ref 6–20)
CHLORIDE: 95 mmol/L — AB (ref 101–111)
CO2: 21 mmol/L — ABNORMAL LOW (ref 22–32)
CREATININE: 4.3 mg/dL — AB (ref 0.61–1.24)
Calcium: 7.8 mg/dL — ABNORMAL LOW (ref 8.9–10.3)
GFR calc Af Amer: 13 mL/min — ABNORMAL LOW (ref >60)
GFR calc non Af Amer: 11 mL/min — ABNORMAL LOW (ref >60)
GLUCOSE: 317 mg/dL — AB (ref 65–99)
POTASSIUM: 5.2 mmol/L — AB (ref 3.5–5.1)
Sodium: 130 mmol/L — ABNORMAL LOW (ref 135–145)

## 2017-02-22 LAB — MAGNESIUM: Magnesium: 1.8 mg/dL (ref 1.7–2.4)

## 2017-02-22 LAB — PHOSPHORUS: Phosphorus: 6.6 mg/dL — ABNORMAL HIGH (ref 2.5–4.6)

## 2017-02-22 LAB — GLUCOSE, CAPILLARY
GLUCOSE-CAPILLARY: 148 mg/dL — AB (ref 65–99)
Glucose-Capillary: 286 mg/dL — ABNORMAL HIGH (ref 65–99)

## 2017-02-22 MED ORDER — SODIUM BICARBONATE 8.4 % IV SOLN
100.0000 meq | Freq: Once | INTRAVENOUS | Status: AC
  Administered 2017-02-22: 100 meq via INTRAVENOUS

## 2017-02-22 MED ORDER — MIDAZOLAM HCL 2 MG/2ML IJ SOLN
INTRAMUSCULAR | Status: AC
  Administered 2017-02-22: 2 mg via INTRAVENOUS
  Filled 2017-02-22: qty 2

## 2017-02-22 MED ORDER — PUREFLOW DIALYSIS SOLUTION: INTRAVENOUS | Status: DC

## 2017-02-22 MED ORDER — ATROPINE SULFATE 1 MG/10ML IJ SOSY
PREFILLED_SYRINGE | INTRAMUSCULAR | Status: AC
  Filled 2017-02-22: qty 10

## 2017-02-22 MED ORDER — MORPHINE BOLUS VIA INFUSION
5.0000 mg | INTRAVENOUS | Status: DC | PRN
  Filled 2017-02-22: qty 20

## 2017-02-22 MED ORDER — SODIUM BICARBONATE 8.4 % IV SOLN
INTRAVENOUS | Status: AC
Start: 2017-02-22 — End: 2017-02-22
  Filled 2017-02-22: qty 100

## 2017-02-22 MED ORDER — ATROPINE SULFATE 1 MG/10ML IJ SOSY
1.0000 mg | PREFILLED_SYRINGE | Freq: Once | INTRAMUSCULAR | Status: AC
  Administered 2017-02-22: 1 mg via INTRAVENOUS

## 2017-02-22 MED ORDER — SODIUM CHLORIDE 0.9 % IV SOLN
5.0000 mg/h | INTRAVENOUS | Status: DC
  Filled 2017-02-22: qty 10

## 2017-02-22 MED ORDER — HEPARIN SODIUM (PORCINE) 1000 UNIT/ML DIALYSIS
1000.0000 [IU] | INTRAMUSCULAR | Status: DC | PRN
  Filled 2017-02-22: qty 6

## 2017-02-22 MED ORDER — LORAZEPAM 2 MG/ML IJ SOLN: 2.0000 mg | INTRAMUSCULAR | Status: DC | PRN

## 2017-02-22 MED ORDER — MIDAZOLAM HCL 2 MG/2ML IJ SOLN
2.0000 mg | INTRAMUSCULAR | Status: DC | PRN
  Administered 2017-02-22: 2 mg via INTRAVENOUS

## 2017-02-22 MED ORDER — SODIUM CHLORIDE 0.9 % IV SOLN
10.0000 mg/h | INTRAVENOUS | Status: DC
  Administered 2017-02-22: 10 mg/h via INTRAVENOUS
  Filled 2017-02-22: qty 10

## 2017-02-23 LAB — SURGICAL PATHOLOGY

## 2017-02-25 NOTE — Progress Notes (Signed)
Chaplain received an order to visit with pt in room ICU18. Chaplain visited with family who were very up set. Once more family showed up the doctors are going to remover the tubes. Chaplain provided emotional and grief support for family. Chaplain is available for followup as needed.     02/15/2017 1000  Clinical Encounter Type  Visited With Family  Visit Type Initial;Spiritual support  Referral From Patient;Nurse  Consult/Referral To Chaplain  Spiritual Encounters  Spiritual Needs Emotional;Grief support

## 2017-02-25 NOTE — Consult Note (Signed)
Name: Kenneth Massey MRN: [id number] DOB: 1929/09/08     CONSULTATION DATE: 2016/06/10  REFERRING MD : Lorretta HarpSchneir  CHIEF COMPLAINT:  Acute cardiac arrest    PROCEDURE:   1.  Left carotid endarterectomy with CorMatrix arterial patch reconstruction  PRE-OPERATIVE DIAGNOSIS: 1.  Critical carotid stenosis 2.  Diabetes; coronary artery disease   HISTORY OF PRESENT ILLNESS:  83 year old white male admitted to the stepdown unit initially for status post left carotid endarterectomy for critical carotid stenosis Patient was being prepped for discharge and prior to discharge patient was alert and awake following commands no apparent distress There is report that patient was ambulating to the toilet and upon completion patient had probable vasovagal syncope and then subsequently fell to the floor and he had acute cardiac arrest PEA for approximately 5-6 minutes CODE BLUE was initiated ACLS protocol started and upon further assessment patient had increased work of breathing after regaining pulse and patient was intubated  Subjective Currently patient is intubated and sedated on full ventilatory support FiO2 at 80% Patient is critically ill with acute severe respiratory failure etiology is unclear at this time but however acute MI and sudden cardiac death is probable Patient with near cardiac arrest this a.m. patient with drop in blood pressure and heart rate atropine 1 mg was given IV several doses of bicarb was also given patient now started on multiple vasopressors high risk for cardiac arrest and death Family at bedside Urine output very poor Follow up nephrology recs   REVIEW OF SYSTEMS:   Unable to provide due to critical illness   VITAL SIGNS: Temp:  [98.4 F (36.9 C)-100.7 F (38.2 C)] 98.6 F (37 C) (10/29 0721) Pulse Rate:  [64-118] 65 (10/29 0800) Resp:  [14-24] 24 (10/29 0800) BP: (95-195)/(43-181) 123/51 (10/29 0800) SpO2:  [96 %-100 %] 99 % (10/29 0800) FiO2  (%):  [85 %-100 %] 100 % (10/29 0409)  Physical Examination:  GENERAL:critically ill appearing, +resp distress HEAD: Normocephalic, atraumatic.  EYES: Pupils equal, round, reactive to light.  No scleral icterus.  MOUTH: Moist mucosal membrane. NECK: Supple. No thyromegaly. No nodules. No JVD.  PULMONARY: +rhonchi CARDIOVASCULAR: S1 and S2. Regular rate and rhythm. No murmurs, rubs, or gallops.  GASTROINTESTINAL: Soft, nontender, -distended. No masses. Positive bowel sounds. No hepatosplenomegaly.  MUSCULOSKELETAL: No swelling, clubbing, or edema.  NEUROLOGIC: obtunded SKIN:intact,warm,dry        Recent Labs Lab 02/20/17 0552 02/20/17 1508 02/21/17 0541  NA 136 138 135  K 4.4 4.0 4.6  CL 104 105 103  CO2 25 23 21*  BUN 38* 36* 40*  CREATININE 1.88* 2.12* 2.42*  GLUCOSE 261* 180* 145*    Recent Labs Lab 02/15/17 1133 02/20/17 0552 02/20/17 1508  HGB 16.2 13.7 15.6  HCT 49.2 41.2 47.6  WBC 7.1 7.5 11.7*  PLT 160 121* 136*     ASSESSMENT / PLAN: 83 year old white male with status post carotid endarterectomy with acute cardiac arrest and severe respiratory failure likely causes acute sudden cardiac death versus acute arrhythmia versus acute MI/CHF versus acute PE versus acute stroke or a combination of any condition stated  1.Respiratory Failure -continue Full MV support -continue Bronchodilator Therapy -Wean Fio2 and PEEP as tolerated  2.Cardiac arrest Check CE Check ECHO  3.follow up labs  4.  Acute renal failure from ATN Continue Foley catheter Continue strict I's and O's nephrology consult-follow up recs May need HD  Patient now with multiorgan failure in the setting of cardiovascular collapse respiratory failure and  acute renal failure  Critical Care Time devoted to patient care services described in this note is 42 minutes.   Overall, patient is critically ill, prognosis is guarded.  Patient with Multiorgan failure and at high risk for cardiac  arrest and death.      Lucie Leather, M.D.  [facility]  Medical Director [facility] Medical Director [facility] Cardio-Pulmonary Department

## 2017-02-25 NOTE — ED Provider Notes (Deleted)
Code Blue CONSULT NOTE  Chief Complaint: Cardiac arrest/unresponsive   Level V Caveat: Unresponsive  History of present illness: I was contacted by the hospital for a CODE BLUE cardiac arrest upstairs and presented to the patient's bedside.    ROS: Unable to obtain, Level V caveat  Scheduled Meds: Continuous Infusions: PRN Meds:.     Past Medical History:  Diagnosis Date  . CAD (coronary artery disease)   . CKD (chronic kidney disease)   . COPD (chronic obstructive pulmonary disease) ([place])   . Diabetes mellitus with neuropathy ([place])   . DM type 2 (diabetes mellitus, type 2) ([place])   . Hypertension         Past Surgical History:  Procedure Laterality Date  . ABDOMINAL AORTIC ANEURYSM REPAIR    . CHOLECYSTECTOMY    . CORONARY ARTERY BYPASS GRAFT    . RENAL ARTERY STENT     Social History        Social History  . Marital status: Married    Spouse name: N/A  . Number of children: N/A  . Years of education: N/A      Occupational History  . Not on file.       Social History Main Topics  . Smoking status: Former Research scientist (life sciences)  . Smokeless tobacco: Never Used  . Alcohol use No  . Drug use: No  . Sexual activity: Not on file       Other Topics Concern  . Not on file      Social History Narrative  . No narrative on file   No Known Allergies  Last set of Vital Signs (not current)    Vitals:   07/18/16 1036  BP: 123/63  Pulse: 83  Resp: 16  Temp: 97.6 F (36.4 C)  SpO2: 93%      Physical Exam  Gen: unresponsive Cardiovascular: pulseless  Resp: apneic. Breath sounds equal bilaterally with bagging  Abd: nondistended  Neuro: GCS 3, unresponsive to pain  HEENT: obvious head trauma to right forehead Neck: No crepitus  Musculoskeletal: No deformity  Skin: warm  Procedures  INTUBATION Performed by: Darel Hong Required items: required blood products, implants, devices, and special equipment available Patient  identity confirmed: provided demographic data and hospital-assigned identification number Time out: Immediately prior to procedure a "time out" was called to verify the correct patient, procedure, equipment, support staff and site/side marked as required. Indications: hypoxia and respiratory distress s/p ROSC Intubation method: mac 4 with 8-0 ET tube Preoxygenation: BVM Sedatives: 20 mg of etomidate Paralytic: 80 mg of rocuronium Tube Size: 8-0 cuffed Post-procedure assessment: chest rise and ETCO2 monitor Breath sounds: equal and absent over the epigastrium Tube secured by Respiratory Therapy Patient tolerated the procedure well with no immediate complications.  CRITICAL CARE Performed by: Darel Hong Total critical care time: 35 Critical care time was exclusive of separately billable procedures and treating other patients. Critical care was necessary to treat or prevent imminent or life-threatening deterioration. Critical care was time spent personally by me on the following activities: development of treatment plan with patient and/or surrogate as well as nursing, discussions with consultants, evaluation of patient's response to treatment, examination of patient, obtaining history from patient or surrogate, ordering and performing treatments and interventions, ordering and review of laboratory studies, ordering and review of radiographic studies, pulse oximetry and re-evaluation of patient's condition.  Cardiopulmonary Resuscitation (CPR) Procedure Note  Directed/Performed by: Darel Hong I personally directed ancillary staff and/or performed CPR in an effort to regain  return of spontaneous circulation and to maintain cardiac, neuro and systemic perfusion.    Medical Decision making  When I arrived to the intensive care unit the patient had CPR in progress.  He is an 83 year old man who is being discharged after a carotid endarterectomy.  Apparently as he was getting his  clothes on he felt weak and fell to the ground striking his forehead.  Nurses were then called to the bedside and the patient was pulseless.  When I arrived CPR was in progress and he had no IV access.  We performed roughly 3 minutes of chest compressions while he was being bagged easily by respiratory therapy and achieved return of spontaneous circulation.  The patient began to wake up and was able to speak and move all 4 extremities, although he was in obvious respiratory distress with rhonchorous breath sounds and saturating 80% despite nonrebreather.  EKG performed which was not a STEMI.  Decision was made to intubate and I used rocuronium given unclear potassium status.  He was intubated without complication.  Stat head CT ordered.  Intensivist was notified and care was transitioned.   ED ECG REPORT I, Darel Hong, the attending physician, personally viewed and interpreted this ECG.  Date: 02/20/2017 EKG Time: 1512 Rate: 86 Rhythm: normal sinus rhythm QRS Axis: normal Intervals: normal ST/T Wave abnormalities: V4 V5 V6 with ST depression Narrative Interpretation: Normal sinus rhythm at 86 with left bundle branch block.  No ST elevation but does appear to have some flat ST depression V4 V5 V6 consistent with subendocardial ischemia     Darel Hong, MD 02/20/17 1531    Darel Hong, MD March 11, 2017 1511

## 2017-02-25 NOTE — Progress Notes (Signed)
Inpatient Diabetes Program Recommendations  AACE/ADA: New Consensus Statement on Inpatient Glycemic Control (2015)  Target Ranges:  Prepandial:   less than 140 mg/dL      Peak postprandial:   less than 180 mg/dL (1-2 hours)      Critically ill patients:  140 - 180 mg/dL   Results for Kenneth Massey, Beckett E (MRN [id number]) as of 2016-10-12 10:05  Ref. Range 02/21/2017 07:46 02/21/2017 12:08 02/21/2017 16:23 02/21/2017 22:25 2016-10-12 07:30  Glucose-Capillary Latest Ref Range: 65 - 99 mg/dL 409120 (H) 811212 (H) 914164 (H) 201 (H) 286 (H)   Review of Glycemic Control  Diabetes history: DM2 Outpatient Diabetes medications: 70/30 5 units BID, Amaryl 4 mg daily Current orders for Inpatient glycemic control: Novolog 0-15 units TID with meals  Inpatient Diabetes Program Recommendations: Correction (SSI): Since patient is NPO and on ventilator, please consider changing frequency of CBGs and Novolog correction to Q4H.  Thanks, Orlando PennerMarie Kie Calvin, RN, MSN, CDE Diabetes Coordinator Inpatient Diabetes Program [phone] (Team Pager from 8am to 5pm)

## 2017-02-25 NOTE — Progress Notes (Signed)
Patient has had sudden change in cardiac rhythm.  Blood pressure and oxygen sats have decreased. Patient remains on Propofol, Fentanyl, Amniodorone, Levophed and Vasopressin.  Daughter contacted.  Will continue to monitor.  NP aware.

## 2017-02-25 NOTE — Care Management (Signed)
Patient admitted after carotid endarterectomy 10/26 .  He experienced cardiac asrrest 10/27.  He was intubated and placed on vent.  This morning patient is being made comfort care and withdraw all support.

## 2017-02-25 NOTE — Clinical Social Work Note (Signed)
CSW consulted in order to inform that patient has been made comfort care. York SpanielMonica Terika Pillard MSW,LCSW [phone]

## 2017-02-25 NOTE — Death Summary Note (Signed)
[facility]    Discharge Summary    Patient ID:  Kenneth Massey MRN: [id number] DOB/AGE: 1930/03/15 83 y.o.  Admit date: 12-30-16 Discharge date: 02/23/2017 Date of Surgery: 12-30-16 Surgeon: Surgeon(s): Schnier, Latina CraverGregory G, MD  Admission Diagnosis: CAROTID ARTERY STENOSIS  Discharge Diagnoses:  CAROTID ARTERY STENOSIS  Secondary Diagnoses: Past Medical History:  Diagnosis Date  . CAD (coronary artery disease)   . CKD (chronic kidney disease)   . COPD (chronic obstructive pulmonary disease) ([facility])   . Diabetes mellitus with neuropathy ([facility])   . DM type 2 (diabetes mellitus, type 2) ([facility])   . Hypertension     Procedure(s): ENDARTERECTOMY CAROTID WITH PATCH  Discharged Condition: Deceased  HPI:  Patient underwent carotid endarterectomy on the day of admission.  He did well with the surgery and there were no complications.  Overnight he was hemodynamically stable and was tolerating 3 L nasal cannula.  At home he is on 2 L nasal cannula.  Postoperative day 1 he was awake and alert neurologically intact neck was clean dry and intact he requested to be discharged home.  While getting dressed in preparation for discharge he collapsed.  A CODE BLUE was called CPR was initiated and he was intubated very expeditiously.  Over the next 48 hours he demonstrated deterioration of his hemodynamics.  He remained alert and followed commands throughout this time whenever sedation holiday was performed.  However after lengthy family discussion the patient was made comfort care and he passed away on Monday, October 29 at approximately 11:00 AM.  Consults:  Treatment Team:  Mosetta PigeonSingh, Harmeet, MD  Significant Diagnostic Studies: CBC Lab Results  Component Value Date   WBC 11.7 (H) 02/20/2017   HGB 15.6 02/20/2017   HCT 47.6 02/20/2017   MCV 93.4 02/20/2017   PLT 136 (L) 02/20/2017    BMET    Component Value Date/Time   NA 130 (L) 01/28/2017 0802   NA 139 08/03/2011 0914   K 5.2 (H) 01/31/2017 0802   K 4.5 08/03/2011 0914   CL 95 (L) 02/24/2017 0802   CL 103 08/03/2011 0914   CO2 21 (L) 01/27/2017 0802   CO2 29 08/03/2011 0914   GLUCOSE 317 (H) 02/04/2017 0802   GLUCOSE 209 (H) 08/03/2011 0914   BUN 62 (H) 02/18/2017 0802   BUN 17 08/03/2011 0914   CREATININE 4.30 (H) 01/30/2017 0802   CREATININE 1.36 (H) 08/03/2011 0914   CALCIUM 7.8 (L) 01/30/2017 0802   CALCIUM 9.1 08/03/2011 0914   GFRNONAA 11 (L) 02/05/2017 0802   GFRNONAA 54 (L) 08/03/2011 0914   GFRAA 13 (L) 02/05/2017 0802   GFRAA >60 08/03/2011 0914   COAG Lab Results  Component Value Date   INR 1.04 02/15/2017   INR 1.1 06/25/2011   INR 1.5 10/19/2008     [facility]:  [facility]  Allergies as of 01/30/2017   No Known Allergies     Medication List    TAKE these medications   albuterol 108 (90 Base) MCG/ACT inhaler Commonly known as:  PROVENTIL HFA;VENTOLIN HFA Inhale 2 puffs into the lungs every 4 (four) hours as needed for wheezing or shortness of breath.   aspirin EC 81 MG tablet Take 81 mg by mouth daily.   carvedilol 6.25 MG tablet Commonly known as:  COREG Take 6.25 mg by mouth 2 (two) times daily.   feeding supplement (GLUCERNA SHAKE) Liqd Take 237 mLs by mouth 2 (two) times daily between meals. What changed:  when to take this   Fluticasone-Salmeterol 250-50 MCG/DOSE Aepb Commonly known as:  ADVAIR Inhale 1 puff into the lungs 2 (two) times daily.   glimepiride 4 MG tablet Commonly known as:  AMARYL Take 4 mg by mouth 2 (two) times daily.   Loratadine 10 MG Caps Take 1 capsule by mouth daily as needed.   metolazone 5 MG tablet Commonly known as:  ZAROXOLYN Take 5 mg by mouth once weekly   NOVOLIN 70/30 RELION (70-30) 100 UNIT/ML injection Generic drug:  insulin NPH-regular Human Inject 5 Units into the skin 2 (two) times daily with a meal.   PRESERVISION AREDS 2 PO Take 1 capsule by mouth 2 (two) times daily.    TESTOSTERONE TD Apply topically once daily. 5% cream   torsemide 20 MG tablet Commonly known as:  DEMADEX TAKE 1 TABLET(20 MG) BY MOUTH EVERY DAY   Vitamin D3 2000 units capsule Take 1,000 Units by mouth daily.   vitamin E 1000 UNIT capsule Generic drug:  vitamin E Take 400 Units by mouth daily.        Signed: Levora Dredge, MD  02/23/2017, 2:02 PM

## 2017-02-25 NOTE — Progress Notes (Signed)
Family is waiting for a few other family members to arrive.  They have decided on comfort care only and discontinuation of ETT and pressors. Dr Belia HemanKasa has spoken to family members regarding above decision.

## 2017-02-25 NOTE — Progress Notes (Signed)
While rounding, CH was notified that family had been called for Pt in room IC-18. There had been a sudden change in his blood pressure. CH provided a spiritual presence for the family as they came to the room. Pt became stable. CH is available for follow up if needed.    03/06/2017 0300  Clinical Encounter Type  Visited With Patient;Family  Visit Type Initial;Spiritual support  Referral From Nurse  Consult/Referral To Chaplain  Spiritual Encounters  Spiritual Needs Prayer;Emotional

## 2017-02-25 NOTE — Progress Notes (Signed)
Nutrition Brief Note  Chart reviewed and patient discussed in rounds. Patient now transitioning to comfort care.   No further nutrition interventions warranted at this time. Please consult RD as needed.   Helane RimaLeanne Darionna Banke, MS, RD, LDN Office: [phone] Pager: [phone] After Hours/Weekend Pager: [phone]

## 2017-02-25 NOTE — ED Provider Notes (Signed)
[facility] CONSULT NOTE  Chief Complaint: Cardiac arrest/unresponsive   Level V Caveat: Unresponsive  History of present illness: I was contacted by the hospital for a CODE BLUE cardiac arrest upstairs and presented to the patient's bedside.    ROS: Unable to obtain, Level V caveat  Scheduled Meds: Continuous Infusions: PRN Meds:.     Past Medical History:  Diagnosis Date  . CAD (coronary artery disease)   . CKD (chronic kidney disease)   . COPD (chronic obstructive pulmonary disease) ([place])   . Diabetes mellitus with neuropathy ([place])   . DM type 2 (diabetes mellitus, type 2) ([place])   . Hypertension         Past Surgical History:  Procedure Laterality Date  . ABDOMINAL AORTIC ANEURYSM REPAIR    . CHOLECYSTECTOMY    . CORONARY ARTERY BYPASS GRAFT    . RENAL ARTERY STENT     Social History        Social History  . Marital status: Married    Spouse name: N/A  . Number of children: N/A  . Years of education: N/A      Occupational History  . Not on file.       Social History Main Topics  . Smoking status: Former Research scientist (life sciences)  . Smokeless tobacco: Never Used  . Alcohol use No  . Drug use: No  . Sexual activity: Not on file       Other Topics Concern  . Not on file      Social History Narrative  . No narrative on file   No Known Allergies  Last set of Vital Signs (not current)    Vitals:   07/18/16 1036  BP: 123/63  Pulse: 83  Resp: 16  Temp: 97.6 F (36.4 C)  SpO2: 93%      Physical Exam  Gen: unresponsive Cardiovascular: pulseless  Resp: apneic. Breath sounds equal bilaterally with bagging  Abd: nondistended  Neuro: GCS 3, unresponsive to pain  HEENT: obvious head trauma to right forehead Neck: No crepitus  Musculoskeletal: No deformity  Skin: warm  Procedures  INTUBATION Performed by: Darel Hong Required items: required blood products, implants, devices, and special equipment available Patient  identity confirmed: provided demographic data and hospital-assigned identification number Time out: Immediately prior to procedure a "time out" was called to verify the correct patient, procedure, equipment, support staff and site/side marked as required. Indications: hypoxia and respiratory distress s/p ROSC Intubation method: mac 4 with 8-0 ET tube Preoxygenation: BVM Sedatives: 20 mg of etomidate Paralytic: 80 mg of rocuronium Tube Size: 8-0 cuffed Post-procedure assessment: chest rise and ETCO2 monitor Breath sounds: equal and absent over the epigastrium Tube secured by Respiratory Therapy Patient tolerated the procedure well with no immediate complications.  CRITICAL CARE Performed by: Darel Hong Total critical care time: 35 Critical care time was exclusive of separately billable procedures and treating other patients. Critical care was necessary to treat or prevent imminent or life-threatening deterioration. Critical care was time spent personally by me on the following activities: development of treatment plan with patient and/or surrogate as well as nursing, discussions with consultants, evaluation of patient's response to treatment, examination of patient, obtaining history from patient or surrogate, ordering and performing treatments and interventions, ordering and review of laboratory studies, ordering and review of radiographic studies, pulse oximetry and re-evaluation of patient's condition.  Cardiopulmonary Resuscitation (CPR) Procedure Note  Directed/Performed by: Darel Hong I personally directed ancillary staff and/or performed CPR in an effort to regain  return of spontaneous circulation and to maintain cardiac, neuro and systemic perfusion.    Medical Decision making  When I arrived to the intensive care unit the patient had CPR in progress.  He is an 83 year old man who is being discharged after a carotid endarterectomy.  Apparently as he was getting his  clothes on he felt weak and fell to the ground striking his forehead.  Nurses were then called to the bedside and the patient was pulseless.  When I arrived CPR was in progress and he had no IV access.  We performed roughly 3 minutes of chest compressions while he was being bagged easily by respiratory therapy and achieved return of spontaneous circulation.  The patient began to wake up and was able to speak and move all 4 extremities, although he was in obvious respiratory distress with rhonchorous breath sounds and saturating 80% despite nonrebreather.  EKG performed which was not a STEMI.  Decision was made to intubate and I used rocuronium given unclear potassium status.  He was intubated without complication.  Stat head CT ordered.  Intensivist was notified and care was transitioned.   ED ECG REPORT I, Darel Hong, the attending physician, personally viewed and interpreted this ECG.  Date: 02/20/2017 EKG Time: 1512 Rate: 86 Rhythm: normal sinus rhythm QRS Axis: normal Intervals: normal ST/T Wave abnormalities: V4 V5 V6 with ST depression Narrative Interpretation: Normal sinus rhythm at 86 with left bundle branch block.  No ST elevation but does appear to have some flat ST depression V4 V5 V6 consistent with subendocardial ischemia     Darel Hong, MD 02/20/17 1531    Darel Hong, MD 03/13/17 1512

## 2017-02-25 NOTE — Progress Notes (Signed)
I have met with family this morning I have assessed the situation and patient's clinical status patient has severe multiorgan failure in the setting of worsening progressive respiratory failure and renal failure  I have explained the situation to the family I have consulted nephrology to assess for possible need for dialysis At this time the family has agreed that patient would not want to live on machines he would not want to be on a ventilator and he would not want to have dialysis  At this time the family has consented and agreed to comfort care measures and to withdraw all support The family is contacting other family members to notify them of their decision  The family members have relayed to me that patient has been suffering for the last several months  patient has stated he has lived too long,  patient has been suffering.  I have relayed this to the ICU team and staff will proceed with comfort care measures once other family members have been notified and the family has consented to initiate the process.  Will place comfort care orders and start morphine infusion.   Family are satisfied with Plan of action and management. All questions answered  Corrin Parker, M.D.  [facility]  Medical Director [place] Director [facility] Cardio-Pulmonary Department

## 2017-02-25 NOTE — Progress Notes (Signed)
Patient is extubated and placed on room air

## 2017-02-25 NOTE — Progress Notes (Signed)
[facility]  ROUNDING NOTE   Subjective:  Renal function deteriorating. Urine output was only 57 cc over the preceding 24 hours. Patient still has periods of significant hypotension. He also remains on the ventilator. 100% FiO2 on the ventilator.   Objective:  Vital signs in last 24 hours:  Temp:  [98.4 F (36.9 C)-99.4 F (37.4 C)] 98.6 F (37 C) (10/29 0721) Pulse Rate:  [52-118] 103 (10/29 0900) Resp:  [14-24] 24 (10/29 0900) BP: (49-195)/(38-181) 151/64 (10/29 0900) SpO2:  [83 %-100 %] 100 % (10/29 0900) FiO2 (%):  [85 %-100 %] 100 % (10/29 0829)  Weight change:  Filed Weights   02/18/2017 1600  Weight: 76.4 kg (168 lb 6.9 oz)    Intake/Output: I/O last 3 completed shifts: In: 3809.7 [I.V.:3749.7; NG/GT:60] Out: 392 [Urine:392]   Intake/Output this shift:  Total I/O In: 112.6 [I.V.:112.6] Out: 25 [Urine:25]  Physical Exam: General: Critically ill appearing  Head: ETT in place  Eyes: Anicteric  Neck: Supple, trachea midline  Lungs:  Scattered rhonchi and rales, on vent 100% fio2  Heart: S1S2 no rubs  Abdomen:  Soft, nontender, bowel sounds present  Extremities: trace peripheral edema.  Neurologic: Intubated/sedated  Skin: No lesions  Access: None at the moment    Basic Metabolic Panel:  Recent Labs Lab 02/15/17 1133 02/20/17 0552 02/20/17 1508 02/21/17 0541 03/04/2017 0802  NA 139 136 138 135 130*  K 3.5 4.4 4.0 4.6 5.2*  CL 98* 104 105 103 95*  CO2 '30 25 23 '$ 21* 21*  GLUCOSE 232* 261* 180* 145* 317*  BUN 42* 38* 36* 40* 62*  CREATININE 1.98* 1.88* 2.12* 2.42* 4.30*  CALCIUM 9.5 7.9* 7.6* 8.1* 7.8*  MG  --   --  1.7  --  1.8  PHOS  --   --   --   --  6.6*    Liver Function Tests:  Recent Labs Lab 02/20/17 1508  AST 69*  ALT 44  ALKPHOS 71  BILITOT 1.3*  PROT 6.3*  ALBUMIN 3.1*   No results for input(s): LIPASE, AMYLASE in the last 168 hours. No results for input(s): AMMONIA in the last 168 hours.  CBC:  Recent  Labs Lab 02/15/17 1133 02/20/17 0552 02/20/17 1508  WBC 7.1 7.5 11.7*  NEUTROABS 4.7  --   --   HGB 16.2 13.7 15.6  HCT 49.2 41.2 47.6  MCV 91.5 91.0 93.4  PLT 160 121* 136*    Cardiac Enzymes:  Recent Labs Lab 02/20/17 1508  TROPONINI 0.04*    BNP: Invalid input(s): POCBNP  CBG:  Recent Labs Lab 02/21/17 0746 02/21/17 1208 02/21/17 1623 02/21/17 2225 03/04/17 0730  GLUCAP 120* 212* 164* 201* 286*    Microbiology: Results for orders placed or performed during the hospital encounter of 01/29/2017  MRSA PCR Screening     Status: None   Collection Time: 02/07/2017  7:26 PM  Result Value Ref Range Status   MRSA by PCR NEGATIVE NEGATIVE Final    Comment:        The GeneXpert MRSA Assay (FDA approved for NASAL specimens only), is one component of a comprehensive MRSA colonization surveillance program. It is not intended to diagnose MRSA infection nor to guide or monitor treatment for MRSA infections.     Coagulation Studies: No results for input(s): LABPROT, INR in the last 72 hours.  Urinalysis:  Recent Labs  02/21/17 1145  COLORURINE AMBER*  LABSPEC 1.015  PHURINE 5.0  GLUCOSEU NEGATIVE  HGBUR SMALL*  BILIRUBINUR NEGATIVE  KETONESUR NEGATIVE  PROTEINUR 100*  NITRITE NEGATIVE  LEUKOCYTESUR SMALL*      Imaging: Dg Abd 1 View  Result Date: 02/20/2017 CLINICAL DATA:  OG tube placement EXAM: ABDOMEN - 1 VIEW COMPARISON:  05/26/2010 FINDINGS: Small pleural effusions. Esophageal tube tip and side-port project over the proximal to mid stomach. Right upper quadrant surgical clips. Visible gas pattern is unremarkable. Aortoiliac vascular stent. IMPRESSION: 1. Esophageal tube tip overlies the mid stomach 2. Visible gas pattern is unremarkable 3. Small pleural effusions Electronically Signed   By: Donavan Foil M.D.   On: 02/20/2017 16:15   Ct Head Wo Contrast  Result Date: 02/20/2017 CLINICAL DATA:  Cardiac arrest and CPR. Left carotid endarterectomy  yesterday. EXAM: CT HEAD WITHOUT CONTRAST TECHNIQUE: Contiguous axial images were obtained from the base of the skull through the vertex without intravenous contrast. COMPARISON:  None available FINDINGS: Brain: No evidence of acute infarction, hemorrhage, hydrocephalus, extra-axial collection or mass lesion/mass effect. Chronic appearing lacunar infarct in the left posterior putamen/ corona radiata. O brain atrophy. No evidence of generalized swelling or elevated intracranial pressure. Vascular: Atherosclerotic calcification.  No high-density vessel. Skull: No acute or aggressive finding. Sinuses/Orbits: Bilateral cataract resection.  No acute finding. IMPRESSION: Senescent changes without acute finding. Electronically Signed   By: Monte Fantasia M.D.   On: 02/20/2017 17:08   Dg Chest Port 1 View  Result Date: 02/20/2017 CLINICAL DATA:  83 year old male with central line placement. EXAM: PORTABLE CHEST 1 VIEW COMPARISON:  Earlier chest radiograph dated 02/20/2017 FINDINGS: There has been interval placement of a right IJ central line the tip over central SVC close to the cavoatrial junction. There is no pneumothorax. The endotracheal tube and enteric tubes appear in similar positioning. There is emphysematous changes of the lungs with diffuse interstitial coarsening. Small bilateral pleural effusions and bilateral lower lung field densities likely representing mild edema. Pneumonia is not excluded. There is top-normal cardiac size. Median sternotomy wires and CABG vascular clips noted. There is atherosclerotic calcification of the thoracic aorta. There is osteopenia with degenerative changes of the shoulders and spine. No acute osseous pathology. IMPRESSION: 1. Interval placement of a right IJ central line the tip over central SVC. No pneumothorax. 2. Endotracheal and enteric tubes in similar position as prior. 3. Small bilateral pleural effusions and mild interstitial edema on a background of emphysema as  seen on the prior radiograph. Electronically Signed   By: Anner Crete M.D.   On: 02/20/2017 18:28   Dg Chest Port 1 View  Result Date: 02/20/2017 CLINICAL DATA:  Post CPR EXAM: PORTABLE CHEST 1 VIEW COMPARISON:  05/03/2015 FINDINGS: Post sternotomy changes. Endotracheal tube tip is about 4 cm superior to the carina. Esophageal tube extends below diaphragm but the tip is non included. Tiny pleural effusions. Borderline to mild cardiomegaly. Mild diffuse interstitial prominence suggesting mild edema. Mild bibasilar atelectasis. IMPRESSION: 1. Endotracheal tube tip is about 4 cm superior to carina 2. Small pleural effusions and hazy bibasilar atelectasis 3. Borderline to mild cardiomegaly. Mild diffuse interstitial prominence may relate to chronic change although mild edema difficult to exclude. Electronically Signed   By: Donavan Foil M.D.   On: 02/20/2017 16:14     Medications:   . sodium chloride    . amiodarone 30 mg/hr (2017/03/09 0700)  . fentaNYL infusion INTRAVENOUS 100 mcg/hr (2017-03-09 0836)  . norepinephrine (LEVOPHED) Adult infusion 18 mcg/min (03/09/17 0836)  . propofol (DIPRIVAN) infusion 5 mcg/kg/min (Mar 09, 2017 0836)  . vasopressin (PITRESSIN) infusion - *  FOR SHOCK* 0.03 Units/min (03/05/17 0800)   . aspirin EC  81 mg Oral Daily  . atropine      . chlorhexidine gluconate (MEDLINE KIT)  15 mL Mouth Rinse BID  . cholecalciferol  1,000 Units Oral Daily  . docusate sodium  100 mg Oral Daily  . insulin aspart  0-15 Units Subcutaneous TID WC  . mouth rinse  15 mL Mouth Rinse 10 times per day  . pantoprazole (PROTONIX) IV  40 mg Intravenous QHS  . sodium bicarbonate      . sodium bicarbonate  650 mg Per Tube QID  . torsemide  20 mg Oral Daily  . vitamin E  400 Units Oral Daily   sodium chloride, acetaminophen **OR** acetaminophen, albuterol, alum & mag hydroxide-simeth, guaiFENesin-dextromethorphan, hydrALAZINE, labetalol, LORazepam, metoprolol tartrate, midazolam, morphine  injection, ondansetron, oxyCODONE, phenol  Assessment/ Plan:  83 y.o. male with  medical problems of COPD, hypertension, type 2 diabetes, chronic kidney disease, coronary artery disease, CABG, CHF, PAD, Renal artery stent, AAA repair who was admitted to [facility] on 01/25/2017 for left carotid endarterectomy which was uneventful however hospital course was complicated by cardiac arrest prior to discharge leading to acute respiratory failure requiring ventilator support  1.  Acute renal failure on chronic kidney disease stage III Baseline creatinine is 1.88/GFR 31 from February 20, 2017 - Renal function has deteriorated. Patient also has evidence of acidosis. Minimal urine output. As such we recommend a course of renal replacement therapy. This was discussed in depth with the patient's family and his wife. She agrees to proceed with continuous renal placement therapy for now. We will consult with pulmonary/critical care for temporary dialysis catheter placement. We will then begin him on CRRT. No ultrafiltration for now.  2.  Acute respiratory failure:  Patient requiring significant oxygen on the ventilator at 100%. Continue ventilatory support for now.  3. Hypotension. Continue multiple pressors to maintain a map of 65 or greater.  4. Overall prognosis quite guarded.   LOS: 3 Kenneth Massey 11/09/189:15 AM

## 2017-02-25 NOTE — Procedures (Signed)
Central Venous Catheter Placement: Indication: Patient receiving vesicant or irritant drug.; Patient receiving intravenous therapy for longer than 5 days.; Patient has limited or no vascular access.   Consent:emergent    Hand washing performed prior to starting the procedure.   Procedure:   An active timeout was performed and correct patient, name, & ID confirmed.   Patient was positioned correctly for central venous access.  Patient was prepped using strict sterile technique including chlorohexadine preps, sterile drape, sterile gown and sterile gloves.    The area was prepped, draped and anesthetized in the usual sterile manner. Patient comfort was obtained.    A triple lumen catheter was placed in RT  Internal Jugular Vein There was good blood return, catheter caps were placed on lumens, catheter flushed easily, the line was secured and a sterile dressing and BIO-PATCH applied.   Ultrasound was used to visualize vasculature and guidance of needle.   Number of Attempts: 1 Complications:none Estimated Blood Loss: none Chest Radiograph indicated and ordered.  Operator: Tyress Loden.   Kenneth Massey Kenneth Massey, M.D.  [facility]  Medical Director ICU-[facility] [place] Medical Director [facility] Cardio-Pulmonary Department     

## 2017-02-25 NOTE — Progress Notes (Signed)
Pt peacefully expired surrounded by family. Family clergy in room. See post mortem note.

## 2017-02-25 DEATH — deceased
